# Patient Record
Sex: Female | Born: 1935
Health system: Southern US, Community
[De-identification: ages and names within clinical notes are randomized; demographics above are authoritative.]

## PROBLEM LIST (undated history)

## (undated) DIAGNOSIS — F32A Depression, unspecified: Secondary | ICD-10-CM

## (undated) DIAGNOSIS — E034 Atrophy of thyroid (acquired): Secondary | ICD-10-CM

## (undated) DIAGNOSIS — F0150 Vascular dementia without behavioral disturbance: Secondary | ICD-10-CM

## (undated) DIAGNOSIS — I509 Heart failure, unspecified: Secondary | ICD-10-CM

## (undated) DIAGNOSIS — F329 Major depressive disorder, single episode, unspecified: Secondary | ICD-10-CM

## (undated) DIAGNOSIS — G43909 Migraine, unspecified, not intractable, without status migrainosus: Secondary | ICD-10-CM

## (undated) DIAGNOSIS — I639 Cerebral infarction, unspecified: Secondary | ICD-10-CM

## (undated) DIAGNOSIS — E039 Hypothyroidism, unspecified: Secondary | ICD-10-CM

## (undated) HISTORY — DX: Migraine, unspecified, not intractable, without status migrainosus: G43.909

## (undated) HISTORY — PX: ABDOMINAL HYSTERECTOMY: SHX81

## (undated) HISTORY — DX: Atrophy of thyroid (acquired): E03.4

## (undated) HISTORY — DX: Hypothyroidism, unspecified: E03.9

## (undated) HISTORY — DX: Vascular dementia, unspecified severity, without behavioral disturbance, psychotic disturbance, mood disturbance, and anxiety: F01.50

## (undated) HISTORY — DX: Depression, unspecified: F32.A

---
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
## 2002-06-11 DIAGNOSIS — I639 Cerebral infarction, unspecified: Secondary | ICD-10-CM

## 2002-06-11 HISTORY — DX: Cerebral infarction, unspecified: I63.9

## 2003-04-26 DIAGNOSIS — I69351 Hemiplegia and hemiparesis following cerebral infarction affecting right dominant side: Secondary | ICD-10-CM | POA: Insufficient documentation

## 2014-08-02 DIAGNOSIS — J159 Unspecified bacterial pneumonia: Secondary | ICD-10-CM | POA: Diagnosis not present

## 2014-08-02 DIAGNOSIS — L2081 Atopic neurodermatitis: Secondary | ICD-10-CM | POA: Diagnosis not present

## 2014-08-02 DIAGNOSIS — J0100 Acute maxillary sinusitis, unspecified: Secondary | ICD-10-CM | POA: Diagnosis not present

## 2014-10-06 DIAGNOSIS — J0100 Acute maxillary sinusitis, unspecified: Secondary | ICD-10-CM | POA: Diagnosis not present

## 2014-10-06 DIAGNOSIS — E034 Atrophy of thyroid (acquired): Secondary | ICD-10-CM | POA: Diagnosis not present

## 2014-10-06 DIAGNOSIS — D649 Anemia, unspecified: Secondary | ICD-10-CM | POA: Diagnosis not present

## 2014-12-07 DIAGNOSIS — R312 Other microscopic hematuria: Secondary | ICD-10-CM | POA: Diagnosis not present

## 2014-12-07 DIAGNOSIS — N3000 Acute cystitis without hematuria: Secondary | ICD-10-CM | POA: Diagnosis not present

## 2014-12-07 DIAGNOSIS — M5408 Panniculitis affecting regions of neck and back, sacral and sacrococcygeal region: Secondary | ICD-10-CM | POA: Diagnosis not present

## 2014-12-08 DIAGNOSIS — M5136 Other intervertebral disc degeneration, lumbar region: Secondary | ICD-10-CM | POA: Diagnosis not present

## 2014-12-08 DIAGNOSIS — R319 Hematuria, unspecified: Secondary | ICD-10-CM | POA: Diagnosis not present

## 2014-12-08 DIAGNOSIS — R590 Localized enlarged lymph nodes: Secondary | ICD-10-CM | POA: Diagnosis not present

## 2014-12-08 DIAGNOSIS — M13851 Other specified arthritis, right hip: Secondary | ICD-10-CM | POA: Diagnosis not present

## 2014-12-08 DIAGNOSIS — I517 Cardiomegaly: Secondary | ICD-10-CM | POA: Diagnosis not present

## 2014-12-08 DIAGNOSIS — M47896 Other spondylosis, lumbar region: Secondary | ICD-10-CM | POA: Diagnosis not present

## 2014-12-08 DIAGNOSIS — M13852 Other specified arthritis, left hip: Secondary | ICD-10-CM | POA: Diagnosis not present

## 2014-12-08 DIAGNOSIS — I700 Atherosclerosis of aorta: Secondary | ICD-10-CM | POA: Diagnosis not present

## 2015-01-18 DIAGNOSIS — J0100 Acute maxillary sinusitis, unspecified: Secondary | ICD-10-CM | POA: Diagnosis not present

## 2015-05-18 DIAGNOSIS — J018 Other acute sinusitis: Secondary | ICD-10-CM | POA: Diagnosis not present

## 2015-08-19 DIAGNOSIS — J018 Other acute sinusitis: Secondary | ICD-10-CM | POA: Diagnosis not present

## 2015-08-19 DIAGNOSIS — E034 Atrophy of thyroid (acquired): Secondary | ICD-10-CM | POA: Diagnosis not present

## 2016-04-09 DIAGNOSIS — I69320 Aphasia following cerebral infarction: Secondary | ICD-10-CM | POA: Diagnosis not present

## 2016-04-09 DIAGNOSIS — E034 Atrophy of thyroid (acquired): Secondary | ICD-10-CM | POA: Diagnosis not present

## 2016-06-06 DIAGNOSIS — J208 Acute bronchitis due to other specified organisms: Secondary | ICD-10-CM | POA: Diagnosis not present

## 2016-07-03 DIAGNOSIS — J208 Acute bronchitis due to other specified organisms: Secondary | ICD-10-CM | POA: Diagnosis not present

## 2016-08-13 DIAGNOSIS — M545 Low back pain: Secondary | ICD-10-CM | POA: Diagnosis not present

## 2016-08-13 DIAGNOSIS — N3001 Acute cystitis with hematuria: Secondary | ICD-10-CM | POA: Diagnosis not present

## 2016-10-23 DIAGNOSIS — J208 Acute bronchitis due to other specified organisms: Secondary | ICD-10-CM | POA: Diagnosis not present

## 2016-12-17 DIAGNOSIS — M79661 Pain in right lower leg: Secondary | ICD-10-CM | POA: Diagnosis not present

## 2016-12-19 DIAGNOSIS — M79661 Pain in right lower leg: Secondary | ICD-10-CM | POA: Diagnosis not present

## 2017-01-02 DIAGNOSIS — M1711 Unilateral primary osteoarthritis, right knee: Secondary | ICD-10-CM | POA: Diagnosis not present

## 2017-01-02 DIAGNOSIS — M545 Low back pain: Secondary | ICD-10-CM | POA: Diagnosis not present

## 2017-01-02 DIAGNOSIS — M25861 Other specified joint disorders, right knee: Secondary | ICD-10-CM | POA: Diagnosis not present

## 2017-01-02 DIAGNOSIS — E034 Atrophy of thyroid (acquired): Secondary | ICD-10-CM | POA: Diagnosis not present

## 2017-01-02 DIAGNOSIS — M25552 Pain in left hip: Secondary | ICD-10-CM | POA: Diagnosis not present

## 2017-01-02 DIAGNOSIS — M25551 Pain in right hip: Secondary | ICD-10-CM | POA: Diagnosis not present

## 2017-01-02 DIAGNOSIS — M25559 Pain in unspecified hip: Secondary | ICD-10-CM | POA: Diagnosis not present

## 2017-01-02 DIAGNOSIS — M5136 Other intervertebral disc degeneration, lumbar region: Secondary | ICD-10-CM | POA: Diagnosis not present

## 2017-01-02 DIAGNOSIS — I69320 Aphasia following cerebral infarction: Secondary | ICD-10-CM | POA: Diagnosis not present

## 2017-10-07 DIAGNOSIS — I69320 Aphasia following cerebral infarction: Secondary | ICD-10-CM | POA: Diagnosis not present

## 2017-10-07 DIAGNOSIS — E034 Atrophy of thyroid (acquired): Secondary | ICD-10-CM | POA: Diagnosis not present

## 2018-04-17 DIAGNOSIS — J208 Acute bronchitis due to other specified organisms: Secondary | ICD-10-CM | POA: Diagnosis not present

## 2018-08-05 DIAGNOSIS — R52 Pain, unspecified: Secondary | ICD-10-CM | POA: Diagnosis not present

## 2018-08-05 DIAGNOSIS — R531 Weakness: Secondary | ICD-10-CM | POA: Diagnosis not present

## 2018-08-05 DIAGNOSIS — R05 Cough: Secondary | ICD-10-CM | POA: Diagnosis not present

## 2018-08-05 DIAGNOSIS — Z7902 Long term (current) use of antithrombotics/antiplatelets: Secondary | ICD-10-CM | POA: Diagnosis not present

## 2018-08-05 DIAGNOSIS — R509 Fever, unspecified: Secondary | ICD-10-CM | POA: Diagnosis not present

## 2018-08-05 DIAGNOSIS — J9811 Atelectasis: Secondary | ICD-10-CM | POA: Diagnosis not present

## 2018-08-05 DIAGNOSIS — I69351 Hemiplegia and hemiparesis following cerebral infarction affecting right dominant side: Secondary | ICD-10-CM | POA: Diagnosis not present

## 2018-08-05 DIAGNOSIS — J181 Lobar pneumonia, unspecified organism: Secondary | ICD-10-CM | POA: Diagnosis not present

## 2018-08-05 DIAGNOSIS — I700 Atherosclerosis of aorta: Secondary | ICD-10-CM | POA: Diagnosis not present

## 2018-08-05 DIAGNOSIS — Z79899 Other long term (current) drug therapy: Secondary | ICD-10-CM | POA: Diagnosis not present

## 2018-08-05 DIAGNOSIS — R0902 Hypoxemia: Secondary | ICD-10-CM | POA: Diagnosis not present

## 2018-08-11 DIAGNOSIS — J208 Acute bronchitis due to other specified organisms: Secondary | ICD-10-CM | POA: Diagnosis not present

## 2018-08-11 DIAGNOSIS — E034 Atrophy of thyroid (acquired): Secondary | ICD-10-CM | POA: Diagnosis not present

## 2018-08-11 DIAGNOSIS — I69320 Aphasia following cerebral infarction: Secondary | ICD-10-CM | POA: Diagnosis not present

## 2018-08-19 DIAGNOSIS — J208 Acute bronchitis due to other specified organisms: Secondary | ICD-10-CM | POA: Diagnosis not present

## 2018-10-08 DIAGNOSIS — Z0000 Encounter for general adult medical examination without abnormal findings: Secondary | ICD-10-CM | POA: Diagnosis not present

## 2018-12-08 DIAGNOSIS — N3001 Acute cystitis with hematuria: Secondary | ICD-10-CM | POA: Diagnosis not present

## 2018-12-08 DIAGNOSIS — N3000 Acute cystitis without hematuria: Secondary | ICD-10-CM | POA: Diagnosis not present

## 2019-01-01 DIAGNOSIS — R0602 Shortness of breath: Secondary | ICD-10-CM | POA: Diagnosis not present

## 2019-01-03 DIAGNOSIS — M1990 Unspecified osteoarthritis, unspecified site: Secondary | ICD-10-CM | POA: Diagnosis not present

## 2019-01-03 DIAGNOSIS — Z87440 Personal history of urinary (tract) infections: Secondary | ICD-10-CM | POA: Diagnosis not present

## 2019-01-03 DIAGNOSIS — E039 Hypothyroidism, unspecified: Secondary | ICD-10-CM | POA: Diagnosis not present

## 2019-01-03 DIAGNOSIS — I69320 Aphasia following cerebral infarction: Secondary | ICD-10-CM | POA: Diagnosis not present

## 2019-01-03 DIAGNOSIS — K259 Gastric ulcer, unspecified as acute or chronic, without hemorrhage or perforation: Secondary | ICD-10-CM | POA: Diagnosis not present

## 2019-01-03 DIAGNOSIS — I69354 Hemiplegia and hemiparesis following cerebral infarction affecting left non-dominant side: Secondary | ICD-10-CM | POA: Diagnosis not present

## 2019-01-03 DIAGNOSIS — N3001 Acute cystitis with hematuria: Secondary | ICD-10-CM | POA: Diagnosis not present

## 2019-01-03 DIAGNOSIS — Z7902 Long term (current) use of antithrombotics/antiplatelets: Secondary | ICD-10-CM | POA: Diagnosis not present

## 2019-01-03 DIAGNOSIS — G43909 Migraine, unspecified, not intractable, without status migrainosus: Secondary | ICD-10-CM | POA: Diagnosis not present

## 2019-01-05 DIAGNOSIS — D6489 Other specified anemias: Secondary | ICD-10-CM | POA: Diagnosis not present

## 2019-01-06 DIAGNOSIS — K259 Gastric ulcer, unspecified as acute or chronic, without hemorrhage or perforation: Secondary | ICD-10-CM | POA: Diagnosis not present

## 2019-01-06 DIAGNOSIS — I69320 Aphasia following cerebral infarction: Secondary | ICD-10-CM | POA: Diagnosis not present

## 2019-01-06 DIAGNOSIS — Z87440 Personal history of urinary (tract) infections: Secondary | ICD-10-CM | POA: Diagnosis not present

## 2019-01-06 DIAGNOSIS — E039 Hypothyroidism, unspecified: Secondary | ICD-10-CM | POA: Diagnosis not present

## 2019-01-06 DIAGNOSIS — I69354 Hemiplegia and hemiparesis following cerebral infarction affecting left non-dominant side: Secondary | ICD-10-CM | POA: Diagnosis not present

## 2019-01-06 DIAGNOSIS — G43909 Migraine, unspecified, not intractable, without status migrainosus: Secondary | ICD-10-CM | POA: Diagnosis not present

## 2019-01-06 DIAGNOSIS — Z7902 Long term (current) use of antithrombotics/antiplatelets: Secondary | ICD-10-CM | POA: Diagnosis not present

## 2019-01-06 DIAGNOSIS — M1990 Unspecified osteoarthritis, unspecified site: Secondary | ICD-10-CM | POA: Diagnosis not present

## 2019-01-06 DIAGNOSIS — N3001 Acute cystitis with hematuria: Secondary | ICD-10-CM | POA: Diagnosis not present

## 2019-01-09 DIAGNOSIS — N3001 Acute cystitis with hematuria: Secondary | ICD-10-CM | POA: Diagnosis not present

## 2019-01-09 DIAGNOSIS — Z87440 Personal history of urinary (tract) infections: Secondary | ICD-10-CM | POA: Diagnosis not present

## 2019-01-09 DIAGNOSIS — I69320 Aphasia following cerebral infarction: Secondary | ICD-10-CM | POA: Diagnosis not present

## 2019-01-09 DIAGNOSIS — M1990 Unspecified osteoarthritis, unspecified site: Secondary | ICD-10-CM | POA: Diagnosis not present

## 2019-01-09 DIAGNOSIS — I69354 Hemiplegia and hemiparesis following cerebral infarction affecting left non-dominant side: Secondary | ICD-10-CM | POA: Diagnosis not present

## 2019-01-09 DIAGNOSIS — Z7902 Long term (current) use of antithrombotics/antiplatelets: Secondary | ICD-10-CM | POA: Diagnosis not present

## 2019-01-09 DIAGNOSIS — G43909 Migraine, unspecified, not intractable, without status migrainosus: Secondary | ICD-10-CM | POA: Diagnosis not present

## 2019-01-09 DIAGNOSIS — E039 Hypothyroidism, unspecified: Secondary | ICD-10-CM | POA: Diagnosis not present

## 2019-01-09 DIAGNOSIS — K259 Gastric ulcer, unspecified as acute or chronic, without hemorrhage or perforation: Secondary | ICD-10-CM | POA: Diagnosis not present

## 2019-01-13 DIAGNOSIS — K259 Gastric ulcer, unspecified as acute or chronic, without hemorrhage or perforation: Secondary | ICD-10-CM | POA: Diagnosis not present

## 2019-01-13 DIAGNOSIS — M1990 Unspecified osteoarthritis, unspecified site: Secondary | ICD-10-CM | POA: Diagnosis not present

## 2019-01-13 DIAGNOSIS — Z7902 Long term (current) use of antithrombotics/antiplatelets: Secondary | ICD-10-CM | POA: Diagnosis not present

## 2019-01-13 DIAGNOSIS — G43909 Migraine, unspecified, not intractable, without status migrainosus: Secondary | ICD-10-CM | POA: Diagnosis not present

## 2019-01-13 DIAGNOSIS — N3001 Acute cystitis with hematuria: Secondary | ICD-10-CM | POA: Diagnosis not present

## 2019-01-13 DIAGNOSIS — I69320 Aphasia following cerebral infarction: Secondary | ICD-10-CM | POA: Diagnosis not present

## 2019-01-13 DIAGNOSIS — I69354 Hemiplegia and hemiparesis following cerebral infarction affecting left non-dominant side: Secondary | ICD-10-CM | POA: Diagnosis not present

## 2019-01-13 DIAGNOSIS — Z87440 Personal history of urinary (tract) infections: Secondary | ICD-10-CM | POA: Diagnosis not present

## 2019-01-13 DIAGNOSIS — E039 Hypothyroidism, unspecified: Secondary | ICD-10-CM | POA: Diagnosis not present

## 2019-01-14 DIAGNOSIS — E039 Hypothyroidism, unspecified: Secondary | ICD-10-CM | POA: Diagnosis not present

## 2019-01-14 DIAGNOSIS — M1990 Unspecified osteoarthritis, unspecified site: Secondary | ICD-10-CM | POA: Diagnosis not present

## 2019-01-14 DIAGNOSIS — Z7902 Long term (current) use of antithrombotics/antiplatelets: Secondary | ICD-10-CM | POA: Diagnosis not present

## 2019-01-14 DIAGNOSIS — K259 Gastric ulcer, unspecified as acute or chronic, without hemorrhage or perforation: Secondary | ICD-10-CM | POA: Diagnosis not present

## 2019-01-14 DIAGNOSIS — I69320 Aphasia following cerebral infarction: Secondary | ICD-10-CM | POA: Diagnosis not present

## 2019-01-14 DIAGNOSIS — N3001 Acute cystitis with hematuria: Secondary | ICD-10-CM | POA: Diagnosis not present

## 2019-01-14 DIAGNOSIS — I69354 Hemiplegia and hemiparesis following cerebral infarction affecting left non-dominant side: Secondary | ICD-10-CM | POA: Diagnosis not present

## 2019-01-14 DIAGNOSIS — G43909 Migraine, unspecified, not intractable, without status migrainosus: Secondary | ICD-10-CM | POA: Diagnosis not present

## 2019-01-14 DIAGNOSIS — Z87440 Personal history of urinary (tract) infections: Secondary | ICD-10-CM | POA: Diagnosis not present

## 2019-01-15 DIAGNOSIS — J208 Acute bronchitis due to other specified organisms: Secondary | ICD-10-CM | POA: Diagnosis not present

## 2019-01-15 DIAGNOSIS — I69320 Aphasia following cerebral infarction: Secondary | ICD-10-CM | POA: Diagnosis not present

## 2019-01-15 DIAGNOSIS — I69354 Hemiplegia and hemiparesis following cerebral infarction affecting left non-dominant side: Secondary | ICD-10-CM | POA: Diagnosis not present

## 2019-01-15 DIAGNOSIS — Z87440 Personal history of urinary (tract) infections: Secondary | ICD-10-CM | POA: Diagnosis not present

## 2019-01-15 DIAGNOSIS — K259 Gastric ulcer, unspecified as acute or chronic, without hemorrhage or perforation: Secondary | ICD-10-CM | POA: Diagnosis not present

## 2019-01-15 DIAGNOSIS — Z7902 Long term (current) use of antithrombotics/antiplatelets: Secondary | ICD-10-CM | POA: Diagnosis not present

## 2019-01-15 DIAGNOSIS — E039 Hypothyroidism, unspecified: Secondary | ICD-10-CM | POA: Diagnosis not present

## 2019-01-15 DIAGNOSIS — G43909 Migraine, unspecified, not intractable, without status migrainosus: Secondary | ICD-10-CM | POA: Diagnosis not present

## 2019-01-15 DIAGNOSIS — M1990 Unspecified osteoarthritis, unspecified site: Secondary | ICD-10-CM | POA: Diagnosis not present

## 2019-01-15 DIAGNOSIS — N3001 Acute cystitis with hematuria: Secondary | ICD-10-CM | POA: Diagnosis not present

## 2019-01-20 DIAGNOSIS — E039 Hypothyroidism, unspecified: Secondary | ICD-10-CM | POA: Diagnosis not present

## 2019-01-20 DIAGNOSIS — N3001 Acute cystitis with hematuria: Secondary | ICD-10-CM | POA: Diagnosis not present

## 2019-01-20 DIAGNOSIS — I69354 Hemiplegia and hemiparesis following cerebral infarction affecting left non-dominant side: Secondary | ICD-10-CM | POA: Diagnosis not present

## 2019-01-20 DIAGNOSIS — Z87440 Personal history of urinary (tract) infections: Secondary | ICD-10-CM | POA: Diagnosis not present

## 2019-01-20 DIAGNOSIS — K259 Gastric ulcer, unspecified as acute or chronic, without hemorrhage or perforation: Secondary | ICD-10-CM | POA: Diagnosis not present

## 2019-01-20 DIAGNOSIS — G43909 Migraine, unspecified, not intractable, without status migrainosus: Secondary | ICD-10-CM | POA: Diagnosis not present

## 2019-01-20 DIAGNOSIS — M1990 Unspecified osteoarthritis, unspecified site: Secondary | ICD-10-CM | POA: Diagnosis not present

## 2019-01-20 DIAGNOSIS — Z7902 Long term (current) use of antithrombotics/antiplatelets: Secondary | ICD-10-CM | POA: Diagnosis not present

## 2019-01-20 DIAGNOSIS — I69320 Aphasia following cerebral infarction: Secondary | ICD-10-CM | POA: Diagnosis not present

## 2019-01-22 DIAGNOSIS — I69354 Hemiplegia and hemiparesis following cerebral infarction affecting left non-dominant side: Secondary | ICD-10-CM | POA: Diagnosis not present

## 2019-01-22 DIAGNOSIS — G43909 Migraine, unspecified, not intractable, without status migrainosus: Secondary | ICD-10-CM | POA: Diagnosis not present

## 2019-01-22 DIAGNOSIS — Z87440 Personal history of urinary (tract) infections: Secondary | ICD-10-CM | POA: Diagnosis not present

## 2019-01-22 DIAGNOSIS — E039 Hypothyroidism, unspecified: Secondary | ICD-10-CM | POA: Diagnosis not present

## 2019-01-22 DIAGNOSIS — N3001 Acute cystitis with hematuria: Secondary | ICD-10-CM | POA: Diagnosis not present

## 2019-01-22 DIAGNOSIS — M1990 Unspecified osteoarthritis, unspecified site: Secondary | ICD-10-CM | POA: Diagnosis not present

## 2019-01-22 DIAGNOSIS — I69320 Aphasia following cerebral infarction: Secondary | ICD-10-CM | POA: Diagnosis not present

## 2019-01-22 DIAGNOSIS — Z7902 Long term (current) use of antithrombotics/antiplatelets: Secondary | ICD-10-CM | POA: Diagnosis not present

## 2019-01-22 DIAGNOSIS — K259 Gastric ulcer, unspecified as acute or chronic, without hemorrhage or perforation: Secondary | ICD-10-CM | POA: Diagnosis not present

## 2019-01-27 DIAGNOSIS — E039 Hypothyroidism, unspecified: Secondary | ICD-10-CM | POA: Diagnosis not present

## 2019-01-27 DIAGNOSIS — M1990 Unspecified osteoarthritis, unspecified site: Secondary | ICD-10-CM | POA: Diagnosis not present

## 2019-01-27 DIAGNOSIS — Z7902 Long term (current) use of antithrombotics/antiplatelets: Secondary | ICD-10-CM | POA: Diagnosis not present

## 2019-01-27 DIAGNOSIS — Z87440 Personal history of urinary (tract) infections: Secondary | ICD-10-CM | POA: Diagnosis not present

## 2019-01-27 DIAGNOSIS — I69320 Aphasia following cerebral infarction: Secondary | ICD-10-CM | POA: Diagnosis not present

## 2019-01-27 DIAGNOSIS — I69354 Hemiplegia and hemiparesis following cerebral infarction affecting left non-dominant side: Secondary | ICD-10-CM | POA: Diagnosis not present

## 2019-01-27 DIAGNOSIS — K259 Gastric ulcer, unspecified as acute or chronic, without hemorrhage or perforation: Secondary | ICD-10-CM | POA: Diagnosis not present

## 2019-01-27 DIAGNOSIS — N3001 Acute cystitis with hematuria: Secondary | ICD-10-CM | POA: Diagnosis not present

## 2019-01-27 DIAGNOSIS — G43909 Migraine, unspecified, not intractable, without status migrainosus: Secondary | ICD-10-CM | POA: Diagnosis not present

## 2019-01-29 DIAGNOSIS — K259 Gastric ulcer, unspecified as acute or chronic, without hemorrhage or perforation: Secondary | ICD-10-CM | POA: Diagnosis not present

## 2019-01-29 DIAGNOSIS — M1990 Unspecified osteoarthritis, unspecified site: Secondary | ICD-10-CM | POA: Diagnosis not present

## 2019-01-29 DIAGNOSIS — E039 Hypothyroidism, unspecified: Secondary | ICD-10-CM | POA: Diagnosis not present

## 2019-01-29 DIAGNOSIS — I69354 Hemiplegia and hemiparesis following cerebral infarction affecting left non-dominant side: Secondary | ICD-10-CM | POA: Diagnosis not present

## 2019-01-29 DIAGNOSIS — Z7902 Long term (current) use of antithrombotics/antiplatelets: Secondary | ICD-10-CM | POA: Diagnosis not present

## 2019-01-29 DIAGNOSIS — N3001 Acute cystitis with hematuria: Secondary | ICD-10-CM | POA: Diagnosis not present

## 2019-01-29 DIAGNOSIS — Z87440 Personal history of urinary (tract) infections: Secondary | ICD-10-CM | POA: Diagnosis not present

## 2019-01-29 DIAGNOSIS — I69320 Aphasia following cerebral infarction: Secondary | ICD-10-CM | POA: Diagnosis not present

## 2019-01-29 DIAGNOSIS — G43909 Migraine, unspecified, not intractable, without status migrainosus: Secondary | ICD-10-CM | POA: Diagnosis not present

## 2019-01-31 ENCOUNTER — Encounter (HOSPITAL_COMMUNITY): Payer: Self-pay

## 2019-01-31 ENCOUNTER — Emergency Department (HOSPITAL_COMMUNITY)
Admission: EM | Admit: 2019-01-31 | Discharge: 2019-01-31 | Disposition: A | Discharge status: Home / Self Care | Payer: Medicare Other | Attending: Emergency Medicine | Admitting: Emergency Medicine

## 2019-01-31 ENCOUNTER — Other Ambulatory Visit: Payer: Self-pay

## 2019-01-31 ENCOUNTER — Emergency Department (HOSPITAL_COMMUNITY): Payer: Medicare Other

## 2019-01-31 DIAGNOSIS — R05 Cough: Secondary | ICD-10-CM | POA: Diagnosis not present

## 2019-01-31 DIAGNOSIS — R059 Cough, unspecified: Secondary | ICD-10-CM

## 2019-01-31 DIAGNOSIS — I509 Heart failure, unspecified: Secondary | ICD-10-CM | POA: Diagnosis not present

## 2019-01-31 DIAGNOSIS — R0981 Nasal congestion: Secondary | ICD-10-CM | POA: Diagnosis not present

## 2019-01-31 DIAGNOSIS — R0989 Other specified symptoms and signs involving the circulatory and respiratory systems: Secondary | ICD-10-CM | POA: Diagnosis not present

## 2019-01-31 HISTORY — DX: Heart failure, unspecified: I50.9

## 2019-01-31 LAB — CBC
HCT: 38.3 % (ref 36.0–46.0)
Hemoglobin: 12.2 g/dL (ref 12.0–15.0)
MCH: 31.4 pg (ref 26.0–34.0)
MCHC: 31.9 g/dL (ref 30.0–36.0)
MCV: 98.7 fL (ref 80.0–100.0)
Platelets: 274 10*3/uL (ref 150–400)
RBC: 3.88 MIL/uL (ref 3.87–5.11)
RDW: 13.2 % (ref 11.5–15.5)
WBC: 7.8 10*3/uL (ref 4.0–10.5)
nRBC: 0 % (ref 0.0–0.2)

## 2019-01-31 LAB — BASIC METABOLIC PANEL
Anion gap: 13 (ref 5–15)
BUN: 17 mg/dL (ref 8–23)
CO2: 24 mmol/L (ref 22–32)
Calcium: 8.9 mg/dL (ref 8.9–10.3)
Chloride: 100 mmol/L (ref 98–111)
Creatinine, Ser: 0.84 mg/dL (ref 0.44–1.00)
GFR calc Af Amer: >60 mL/min (ref >60)
GFR calc non Af Amer: >60 mL/min (ref >60)
Glucose, Bld: 115 mg/dL — ABNORMAL HIGH (ref 70–99)
Potassium: 3.7 mmol/L (ref 3.5–5.1)
Sodium: 137 mmol/L (ref 135–145)

## 2019-01-31 MED ORDER — ALBUTEROL SULFATE HFA 108 (90 BASE) MCG/ACT IN AERS: 1.0000 | INHALATION_SPRAY | Freq: Four times a day (QID) | RESPIRATORY_TRACT | 0 refills | Status: DC | PRN

## 2019-01-31 MED ORDER — SODIUM CHLORIDE 0.9% FLUSH: 3.0000 mL | Freq: Once | INTRAVENOUS | Status: DC

## 2019-01-31 MED ORDER — BENZONATATE 100 MG PO CAPS: 100.0000 mg | ORAL_CAPSULE | Freq: Three times a day (TID) | ORAL | 0 refills | Status: DC | PRN

## 2019-01-31 MED ORDER — FLUTICASONE PROPIONATE 50 MCG/ACT NA SUSP: 1.0000 | Freq: Every day | NASAL | 0 refills | Status: DC

## 2019-01-31 NOTE — ED Triage Notes (Signed)
Pt accompanied by family member who states she has had increased chest congestion, hx of CHF on lasix. Pt coughing up phlegm, no fevers. Family reports having a hard time getting an appointment with her PCP, they told her to come here.

## 2019-01-31 NOTE — ED Notes (Signed)
Patient transported to X-ray 

## 2019-01-31 NOTE — ED Provider Notes (Addendum)
[facility]   CSN: [id number] Arrival date & time: 01/31/19  1035     History   Chief Complaint Chief Complaint  Patient presents with  . Chest Congestion    HPI Veronica [name] is a 83 y.o. female with a hx of CHF & prior stroke w/ significant residual deficits who presents to the ED with her daughter with concern for chest congestion for the past 2-3 months.  Level 5 caveat applies secondary to patient's baseline difficulty with speech s/p CVA. Patient's daughter provided history- she states that for the past 2-3 months the patient has had issues with cough productive of phelgm sputum, states initially yellow now more of a yellow/brown & very thick. The patient's breathing has sounded noisy with crackling & wheezing at times, and she also believes she is somewhat short of breath intermittently. No alleviating/aggravating factors. Seen by PCP and given Augment for 10 days BID which was completed 1 week prior without improvement. Called PCP about this who directed them to the ED. She notes she has had similar noisy breathing & cough that cleared w/ abx in the past but this time did not. She had issues with peripheral edema that began a few months ago but this has been controlled with Lasix & she does not have any acute worsening of leg swelling, states they look at baseline. Patient lives @ home with her daughter who assists her, there is homehealth, she walks very little & mostly is pushed in a wheelchair. Denies fever, complaints of pain, recent sick contacts w/ similar, vomiting, or diarrhea. No mental status change from baseline.      HPI  Past Medical History:  Diagnosis Date  . CHF (congestive heart failure) (HCC)   . Stroke [facility](HCC) 2004    There are no active problems to display for this patient.   Past Surgical History:  Procedure Laterality Date  . ABDOMINAL HYSTERECTOMY       OB History   No obstetric history on  file.      Home Medications    Prior to Admission medications   Not on File    Family History No family history on file.  Social History Social History   Tobacco Use  . Smoking status: Not on file  Substance Use Topics  . Alcohol use: Not on file  . Drug use: Not on file     Allergies   Patient has no allergy information on record.   Review of Systems Review of Systems  Unable to perform ROS: Other (Patient minimally verbal s/p CVA)     Physical Exam Updated Vital Signs BP (!) 143/74   Pulse 99   Temp 99.3 F (37.4 C) (Oral)   Resp 17   SpO2 95%   Physical Exam Vitals signs and nursing note reviewed.  Constitutional:      General: She is not in acute distress.    Appearance: She is well-developed.  HENT:     Head: Normocephalic and atraumatic.     Right Ear: Ear canal normal. Tympanic membrane is not perforated, erythematous, retracted or bulging.     Left Ear: Ear canal normal. Tympanic membrane is not perforated, erythematous, retracted or bulging.     Ears:     Comments: No mastoid erythema/swelling/tenderness.  L ear w/ hearing aide in place.     Nose: Congestion present.     Right Sinus: No maxillary sinus tenderness or frontal sinus tenderness.  Left Sinus: No maxillary sinus tenderness or frontal sinus tenderness.     Mouth/Throat:     Pharynx: Uvula midline. No oropharyngeal exudate or posterior oropharyngeal erythema.  Eyes:     General:        Right eye: No discharge.        Left eye: No discharge.     Conjunctiva/sclera: Conjunctivae normal.     Pupils: Pupils are equal, round, and reactive to light.  Neck:     Musculoskeletal: Normal range of motion and neck supple. No edema or neck rigidity.  Cardiovascular:     Rate and Rhythm: Normal rate and regular rhythm.     Heart sounds: No murmur.  Pulmonary:     Effort: Pulmonary effort is normal. No respiratory distress.     Breath sounds: No wheezing.     Comments: Course bibasilar  breath sounds.  Abdominal:     General: There is no distension.     Palpations: Abdomen is soft.     Tenderness: There is no abdominal tenderness.  Musculoskeletal:     Comments: Trace distal lower leg edema w/o overlying erythema/warmth.   Lymphadenopathy:     Cervical: No cervical adenopathy.  Skin:    General: Skin is warm and dry.     Findings: No rash.  Neurological:     Mental Status: She is alert.  Psychiatric:        Behavior: Behavior normal.    ED Treatments / Results  Labs (all labs ordered are listed, but only abnormal results are displayed) Labs Reviewed  BASIC METABOLIC PANEL - Abnormal; Notable for the following components:      Result Value   Glucose, Bld 115 (*)    All other components within normal limits  CBC    EKG EKG Interpretation  Date/Time:  Saturday January 31 2019 11:47:32 EDT Ventricular Rate:  98 PR Interval:  184 QRS Duration: 72 QT Interval:  360 QTC Calculation: 459 R Axis:   -28 Text Interpretation:  Normal sinus rhythm Low voltage QRS Cannot rule out Anterior infarct , age undetermined Abnormal ECG Confirmed by Margarita Grizzleay, Danielle [phone] on 01/31/2019 1:37:35 PM   Radiology Dg Chest 2 View  Result Date: 01/31/2019 CLINICAL DATA:  Chest congestion.  Hx CHF. EXAM: CHEST - 2 VIEW COMPARISON:  08/05/2018 FINDINGS: Heart size is normal. The aorta is atherosclerotic. Lungs are free of focal consolidations and pleural effusions. No edema. IMPRESSION: No evidence for acute cardiopulmonary abnormality. Aortic atherosclerosis.  (ICD10-I70.0) Electronically Signed   By: Norva PavlovElizabeth  Brown M.D.   On: 01/31/2019 13:30    Procedures Procedures (including critical care time)  Medications Ordered in ED Medications  sodium chloride flush (NS) 0.9 % injection 3 mL (has no administration in time range)     Initial Impression / Assessment and Plan / ED Course  I have reviewed the triage vital signs and the nursing notes.  Pertinent labs & imaging  results that were available during my care of the patient were reviewed by me and considered in my medical decision making (see chart for details).   Patient presents to the ED w/ her daughter who has provided history for evaluation of cough & chest congestion x 2-3 months. Recently completed 10 day course of Augmentin 1 week prior without significant improvement. Patient is nontoxic appearing, resting comfortably, vitals without significant abnormality. Exam with course bibasilar breath sounds somewhat but no signs of respiratory distress.   CBC: No leukocytosis or anemia.  BMP: No significant  electrolyte derangement. Mild hyperglycemia.  EKG: Normal sinus rhythm, no STEMI- no chest pain.  CXR: No evidence for acute cardiopulmonary abnormality. Aortic atherosclerosis.    CXR w/o fluids overload, no peripheral edema- not consistent w/ CHF exacerbation.  No infiltrate on CXR to suggest pneumonia, no fever or sinus tenderness s/p augmentin- doubt acute bacterial sinusitis- do not feel additional abx are necessary at this time.  No wheezing, no hx of tobacco abuse- does not seem like COPD.  Additionally no underlying anemia or electrolyte derangement.  Low risk wells- doubt PE.   Unclear definitive etiology but overall patient is well appearing, not in respiratory distress, appears appropriate for discharge home. Will give flonase, tessalon, & trial inhaler for symptomatic management. I discussed results, treatment plan, need for follow-up, and return precautions with the patient & her daughter. Provided opportunity for questions, patient's daughter confirmed understanding and is in agreement with plan.   Findings and plan of care discussed with supervising physician Dr. Jeanell Sparrow who has evaluated patient in the ED & is in agreement.    Final Clinical Impressions(s) / ED Diagnoses   Final diagnoses:  Cough    ED Discharge Orders         Ordered    fluticasone (FLONASE) 50 MCG/ACT nasal spray   Daily     01/31/19 1513    benzonatate (TESSALON) 100 MG capsule  3 times daily PRN     01/31/19 1513    albuterol (VENTOLIN HFA) 108 (90 Base) MCG/ACT inhaler  Every 6 hours PRN     01/31/19 1513           Dolorez Jeffrey, Glynda Jaeger, PA-C 01/31/19 1520    Pattricia Boss, MD 01/31/19 1537  Ordered covid swab after discharge paperwork printed unfortunately patient has left the department. Suspicion for this is low given sxs for 2-3 months at this time.  BERDIA LACHMAN was evaluated in Emergency Department on 01/31/2019 for the symptoms described in the history of present illness. He/she was evaluated in the context of the global COVID-19 pandemic, which necessitated consideration that the patient might be at risk for infection with the SARS-CoV-2 virus that causes COVID-19. Institutional protocols and algorithms that pertain to the evaluation of patients at risk for COVID-19 are in a state of rapid change based on information released by regulatory bodies including the CDC and federal and state organizations. These policies and algorithms were followed during the patient's care in the ED.     Leafy Kindle 01/31/19 1539    Pattricia Boss, MD 02/01/19 1520

## 2019-01-31 NOTE — Discharge Instructions (Addendum)
You were seen in the ER today for cough & chest congestion.   Your chest x-ray did not show pneumonia, fluid overload, or other acute abnormalities. There is some aortic atherosclerosis- plaque in the artery in the chest but this is not a new finding that would be causing your symptoms today, but instead something that should be followed up by primary care.   Labs were all normal.   We are sending you home with medicines to try to help manage your symptoms:  - Flonase- use 1 spray per nostril daily for congestion.  - Tessalon- take every 8 hours as needed for coughing - Inhaler- use 1-2 puffs every 6 hours for wheezing/shortness of breath.   We have prescribed you new medication(s) today. Discuss the medications prescribed today with your pharmacist as they can have adverse effects and interactions with your other medicines including over the counter and prescribed medications. Seek medical evaluation if you start to experience new or abnormal symptoms after taking one of these medicines, seek care immediately if you start to experience difficulty breathing, feeling of your throat closing, facial swelling, or rash as these could be indications of a more serious allergic reaction  Follow up with primary care within 3 days. Return to the ED for new or worsening symptoms including but not limited to fever, trouble breathing, chest pain, passing out, or any other concerns.

## 2019-02-03 DIAGNOSIS — I69354 Hemiplegia and hemiparesis following cerebral infarction affecting left non-dominant side: Secondary | ICD-10-CM | POA: Diagnosis not present

## 2019-02-03 DIAGNOSIS — G43909 Migraine, unspecified, not intractable, without status migrainosus: Secondary | ICD-10-CM | POA: Diagnosis not present

## 2019-02-03 DIAGNOSIS — K259 Gastric ulcer, unspecified as acute or chronic, without hemorrhage or perforation: Secondary | ICD-10-CM | POA: Diagnosis not present

## 2019-02-03 DIAGNOSIS — I69320 Aphasia following cerebral infarction: Secondary | ICD-10-CM | POA: Diagnosis not present

## 2019-02-03 DIAGNOSIS — N3001 Acute cystitis with hematuria: Secondary | ICD-10-CM | POA: Diagnosis not present

## 2019-02-03 DIAGNOSIS — Z87440 Personal history of urinary (tract) infections: Secondary | ICD-10-CM | POA: Diagnosis not present

## 2019-02-03 DIAGNOSIS — M1990 Unspecified osteoarthritis, unspecified site: Secondary | ICD-10-CM | POA: Diagnosis not present

## 2019-02-03 DIAGNOSIS — Z7902 Long term (current) use of antithrombotics/antiplatelets: Secondary | ICD-10-CM | POA: Diagnosis not present

## 2019-02-03 DIAGNOSIS — E039 Hypothyroidism, unspecified: Secondary | ICD-10-CM | POA: Diagnosis not present

## 2019-02-05 DIAGNOSIS — I69354 Hemiplegia and hemiparesis following cerebral infarction affecting left non-dominant side: Secondary | ICD-10-CM | POA: Diagnosis not present

## 2019-02-05 DIAGNOSIS — M1990 Unspecified osteoarthritis, unspecified site: Secondary | ICD-10-CM | POA: Diagnosis not present

## 2019-02-05 DIAGNOSIS — K259 Gastric ulcer, unspecified as acute or chronic, without hemorrhage or perforation: Secondary | ICD-10-CM | POA: Diagnosis not present

## 2019-02-05 DIAGNOSIS — G43909 Migraine, unspecified, not intractable, without status migrainosus: Secondary | ICD-10-CM | POA: Diagnosis not present

## 2019-02-05 DIAGNOSIS — I69320 Aphasia following cerebral infarction: Secondary | ICD-10-CM | POA: Diagnosis not present

## 2019-02-05 DIAGNOSIS — E039 Hypothyroidism, unspecified: Secondary | ICD-10-CM | POA: Diagnosis not present

## 2019-02-05 DIAGNOSIS — N3001 Acute cystitis with hematuria: Secondary | ICD-10-CM | POA: Diagnosis not present

## 2019-02-05 DIAGNOSIS — Z7902 Long term (current) use of antithrombotics/antiplatelets: Secondary | ICD-10-CM | POA: Diagnosis not present

## 2019-02-05 DIAGNOSIS — Z87440 Personal history of urinary (tract) infections: Secondary | ICD-10-CM | POA: Diagnosis not present

## 2019-02-06 DIAGNOSIS — M1990 Unspecified osteoarthritis, unspecified site: Secondary | ICD-10-CM | POA: Diagnosis not present

## 2019-02-06 DIAGNOSIS — G43909 Migraine, unspecified, not intractable, without status migrainosus: Secondary | ICD-10-CM | POA: Diagnosis not present

## 2019-02-06 DIAGNOSIS — I69320 Aphasia following cerebral infarction: Secondary | ICD-10-CM | POA: Diagnosis not present

## 2019-02-06 DIAGNOSIS — Z87440 Personal history of urinary (tract) infections: Secondary | ICD-10-CM | POA: Diagnosis not present

## 2019-02-06 DIAGNOSIS — E039 Hypothyroidism, unspecified: Secondary | ICD-10-CM | POA: Diagnosis not present

## 2019-02-06 DIAGNOSIS — I69354 Hemiplegia and hemiparesis following cerebral infarction affecting left non-dominant side: Secondary | ICD-10-CM | POA: Diagnosis not present

## 2019-02-06 DIAGNOSIS — K259 Gastric ulcer, unspecified as acute or chronic, without hemorrhage or perforation: Secondary | ICD-10-CM | POA: Diagnosis not present

## 2019-02-06 DIAGNOSIS — Z7902 Long term (current) use of antithrombotics/antiplatelets: Secondary | ICD-10-CM | POA: Diagnosis not present

## 2019-02-06 DIAGNOSIS — N3001 Acute cystitis with hematuria: Secondary | ICD-10-CM | POA: Diagnosis not present

## 2019-02-10 DIAGNOSIS — Z7902 Long term (current) use of antithrombotics/antiplatelets: Secondary | ICD-10-CM | POA: Diagnosis not present

## 2019-02-10 DIAGNOSIS — I69354 Hemiplegia and hemiparesis following cerebral infarction affecting left non-dominant side: Secondary | ICD-10-CM | POA: Diagnosis not present

## 2019-02-10 DIAGNOSIS — K259 Gastric ulcer, unspecified as acute or chronic, without hemorrhage or perforation: Secondary | ICD-10-CM | POA: Diagnosis not present

## 2019-02-10 DIAGNOSIS — I69320 Aphasia following cerebral infarction: Secondary | ICD-10-CM | POA: Diagnosis not present

## 2019-02-10 DIAGNOSIS — M1990 Unspecified osteoarthritis, unspecified site: Secondary | ICD-10-CM | POA: Diagnosis not present

## 2019-02-10 DIAGNOSIS — N3001 Acute cystitis with hematuria: Secondary | ICD-10-CM | POA: Diagnosis not present

## 2019-02-10 DIAGNOSIS — G43909 Migraine, unspecified, not intractable, without status migrainosus: Secondary | ICD-10-CM | POA: Diagnosis not present

## 2019-02-10 DIAGNOSIS — E039 Hypothyroidism, unspecified: Secondary | ICD-10-CM | POA: Diagnosis not present

## 2019-02-10 DIAGNOSIS — Z87440 Personal history of urinary (tract) infections: Secondary | ICD-10-CM | POA: Diagnosis not present

## 2019-02-12 DIAGNOSIS — E039 Hypothyroidism, unspecified: Secondary | ICD-10-CM | POA: Diagnosis not present

## 2019-02-12 DIAGNOSIS — M1990 Unspecified osteoarthritis, unspecified site: Secondary | ICD-10-CM | POA: Diagnosis not present

## 2019-02-12 DIAGNOSIS — I69320 Aphasia following cerebral infarction: Secondary | ICD-10-CM | POA: Diagnosis not present

## 2019-02-12 DIAGNOSIS — Z7902 Long term (current) use of antithrombotics/antiplatelets: Secondary | ICD-10-CM | POA: Diagnosis not present

## 2019-02-12 DIAGNOSIS — K259 Gastric ulcer, unspecified as acute or chronic, without hemorrhage or perforation: Secondary | ICD-10-CM | POA: Diagnosis not present

## 2019-02-12 DIAGNOSIS — G43909 Migraine, unspecified, not intractable, without status migrainosus: Secondary | ICD-10-CM | POA: Diagnosis not present

## 2019-02-12 DIAGNOSIS — Z87440 Personal history of urinary (tract) infections: Secondary | ICD-10-CM | POA: Diagnosis not present

## 2019-02-12 DIAGNOSIS — I69354 Hemiplegia and hemiparesis following cerebral infarction affecting left non-dominant side: Secondary | ICD-10-CM | POA: Diagnosis not present

## 2019-02-12 DIAGNOSIS — N3001 Acute cystitis with hematuria: Secondary | ICD-10-CM | POA: Diagnosis not present

## 2019-02-19 DIAGNOSIS — G43909 Migraine, unspecified, not intractable, without status migrainosus: Secondary | ICD-10-CM | POA: Diagnosis not present

## 2019-02-19 DIAGNOSIS — I69354 Hemiplegia and hemiparesis following cerebral infarction affecting left non-dominant side: Secondary | ICD-10-CM | POA: Diagnosis not present

## 2019-02-19 DIAGNOSIS — N3001 Acute cystitis with hematuria: Secondary | ICD-10-CM | POA: Diagnosis not present

## 2019-02-19 DIAGNOSIS — Z87440 Personal history of urinary (tract) infections: Secondary | ICD-10-CM | POA: Diagnosis not present

## 2019-02-19 DIAGNOSIS — K259 Gastric ulcer, unspecified as acute or chronic, without hemorrhage or perforation: Secondary | ICD-10-CM | POA: Diagnosis not present

## 2019-02-19 DIAGNOSIS — I69320 Aphasia following cerebral infarction: Secondary | ICD-10-CM | POA: Diagnosis not present

## 2019-02-19 DIAGNOSIS — M1990 Unspecified osteoarthritis, unspecified site: Secondary | ICD-10-CM | POA: Diagnosis not present

## 2019-02-19 DIAGNOSIS — E039 Hypothyroidism, unspecified: Secondary | ICD-10-CM | POA: Diagnosis not present

## 2019-02-19 DIAGNOSIS — Z7902 Long term (current) use of antithrombotics/antiplatelets: Secondary | ICD-10-CM | POA: Diagnosis not present

## 2019-02-20 DIAGNOSIS — R05 Cough: Secondary | ICD-10-CM | POA: Diagnosis not present

## 2019-02-25 DIAGNOSIS — D649 Anemia, unspecified: Secondary | ICD-10-CM | POA: Diagnosis not present

## 2019-03-03 DIAGNOSIS — E039 Hypothyroidism, unspecified: Secondary | ICD-10-CM | POA: Diagnosis not present

## 2019-03-03 DIAGNOSIS — K259 Gastric ulcer, unspecified as acute or chronic, without hemorrhage or perforation: Secondary | ICD-10-CM | POA: Diagnosis not present

## 2019-03-03 DIAGNOSIS — I69320 Aphasia following cerebral infarction: Secondary | ICD-10-CM | POA: Diagnosis not present

## 2019-03-03 DIAGNOSIS — Z7902 Long term (current) use of antithrombotics/antiplatelets: Secondary | ICD-10-CM | POA: Diagnosis not present

## 2019-03-03 DIAGNOSIS — G43909 Migraine, unspecified, not intractable, without status migrainosus: Secondary | ICD-10-CM | POA: Diagnosis not present

## 2019-03-03 DIAGNOSIS — N3001 Acute cystitis with hematuria: Secondary | ICD-10-CM | POA: Diagnosis not present

## 2019-03-03 DIAGNOSIS — I69354 Hemiplegia and hemiparesis following cerebral infarction affecting left non-dominant side: Secondary | ICD-10-CM | POA: Diagnosis not present

## 2019-03-03 DIAGNOSIS — M1990 Unspecified osteoarthritis, unspecified site: Secondary | ICD-10-CM | POA: Diagnosis not present

## 2019-03-03 DIAGNOSIS — Z87440 Personal history of urinary (tract) infections: Secondary | ICD-10-CM | POA: Diagnosis not present

## 2019-04-15 DIAGNOSIS — I69320 Aphasia following cerebral infarction: Secondary | ICD-10-CM | POA: Diagnosis not present

## 2019-04-15 DIAGNOSIS — M6281 Muscle weakness (generalized): Secondary | ICD-10-CM | POA: Diagnosis not present

## 2019-04-15 DIAGNOSIS — E034 Atrophy of thyroid (acquired): Secondary | ICD-10-CM | POA: Diagnosis not present

## 2019-06-10 DIAGNOSIS — J018 Other acute sinusitis: Secondary | ICD-10-CM | POA: Diagnosis not present

## 2019-06-10 DIAGNOSIS — Z1159 Encounter for screening for other viral diseases: Secondary | ICD-10-CM | POA: Diagnosis not present

## 2019-07-01 DIAGNOSIS — J3089 Other allergic rhinitis: Secondary | ICD-10-CM | POA: Diagnosis not present

## 2019-07-01 DIAGNOSIS — E038 Other specified hypothyroidism: Secondary | ICD-10-CM | POA: Diagnosis not present

## 2019-07-15 ENCOUNTER — Ambulatory Visit: Payer: Self-pay | Admitting: Allergy and Immunology

## 2019-07-22 ENCOUNTER — Ambulatory Visit: Payer: Self-pay | Admitting: Allergy and Immunology

## 2019-07-27 ENCOUNTER — Other Ambulatory Visit: Payer: Self-pay

## 2019-07-27 DIAGNOSIS — R7989 Other specified abnormal findings of blood chemistry: Secondary | ICD-10-CM

## 2019-07-29 ENCOUNTER — Ambulatory Visit: Payer: Self-pay | Admitting: Allergy and Immunology

## 2019-08-12 ENCOUNTER — Other Ambulatory Visit: Payer: Medicare Other

## 2019-08-12 ENCOUNTER — Other Ambulatory Visit: Payer: Self-pay

## 2019-08-12 DIAGNOSIS — R7989 Other specified abnormal findings of blood chemistry: Secondary | ICD-10-CM | POA: Diagnosis not present

## 2019-08-13 LAB — TSH: TSH: 1.55 u[IU]/mL (ref 0.450–4.500)

## 2019-08-27 ENCOUNTER — Ambulatory Visit: Payer: Medicare Other | Admitting: Physician Assistant

## 2019-09-05 ENCOUNTER — Other Ambulatory Visit: Payer: Self-pay | Admitting: Family Medicine

## 2019-09-15 ENCOUNTER — Other Ambulatory Visit: Payer: Self-pay | Admitting: Family Medicine

## 2019-09-24 ENCOUNTER — Other Ambulatory Visit: Payer: Self-pay | Admitting: Family Medicine

## 2019-09-28 ENCOUNTER — Other Ambulatory Visit: Payer: Self-pay

## 2019-09-28 ENCOUNTER — Telehealth: Payer: Self-pay

## 2019-09-28 MED ORDER — FUROSEMIDE 20 MG PO TABS: ORAL_TABLET | ORAL | 0 refills | Status: DC

## 2019-10-01 NOTE — Telephone Encounter (Signed)
error 

## 2019-10-06 ENCOUNTER — Encounter: Payer: Self-pay | Admitting: Family Medicine

## 2019-10-06 NOTE — Progress Notes (Signed)
Acute Office Visit  Subjective:    Patient ID: Veronica [name], female    DOB: 1936/04/06, 84 y.o.   MRN: [id number]  Chief Complaint  Patient presents with  . skin lesion    only on Buttocks x 5 days  . Edema    Lower legs and feet  . Home Health    Daughter states she needs home health for patient, expressed it is almost impossible to get patient here for her appointments.    HPI Patient is an 84 yo female with history of stroke and persistent sequela I of Broca's aphasia and right hemiparesis who presents today for evaluation of blistering lesions on buttock that are occurring in the mornings after she has soiled herself over night.  She also has ulcerations on her sacrum. She has been diligently using a barrier cream. Her daughter has been taking care of her full-time.  In the mornings when she wakes she tries to wash her as best she is able to and clean her soiled areas.  The patient requires maximum assistance for bathing, dressing, using the restroom (wears depends.)  She is able to feed herself with her left hand.  In addition she becomes very agitated when she has to go in the car anywhere including the doctor's office.  She frequently will find her daughter which consists of shaking and making loud noises which is obviously perceived as resistance.  They do not have a hospital bed.  They do not have an appropriate wheelchair.  Her daughter is unable to get her into their bathtub and so her bathing and for the last year and a half has consisted of sponge baths.  Her daughter is also watching her own 3 grandchildren during the day although the 82 year old is very helpful in taking care of Ms. Veronica [name].  She has not had home health care previously.  Her previous provider had been abrupt and apparently told Veronica [name]'s daughter McSchall that she would have to put her in a nursing home.  The patient's daughter is adamant to keep her at home.  She is a very poor experience with her  father in a nursing home and she refuses to put her mother through this.  She also has no CNA help.  Her husband works full-time and apparently uncomfortable helping with her mother. Has been diagnosed with vascular dementia with behavioral disturbance. She has a longstanding history of depression and is currently on Celexa. Her poor communication makes it nearly impossible to assess the extent of dementia.   Past Medical History:  Diagnosis Date  . Atrophy of thyroid   . CHF (congestive heart failure) (HCC)   . Depression   . Hypothyroidism   . Migraines   . Stroke [facility]) 2004  . Vascular dementia [facility])     Past Surgical History:  Procedure Laterality Date  . ABDOMINAL HYSTERECTOMY      Family History  Problem Relation Age of Onset  . Stroke Other   . Hypertension Other   . CAD Other   . Cancer Other     Social History   Socioeconomic History  . Marital status: Widowed    Spouse name: Not on file  . Number of children: 3  . Years of education: Not on file  . Highest education level: Not on file  Occupational History  . Not on file  Tobacco Use  . Smoking status: Never Smoker  . Smokeless tobacco: Never Used  Substance and Sexual Activity  .  Alcohol use: Never  . Drug use: Never  . Sexual activity: Not on file  Other Topics Concern  . Not on file  Social History Narrative  . Not on file   Social Determinants of Health   Financial Resource Strain:   . Difficulty of Paying Living Expenses:   Food Insecurity:   . Worried About Programme researcher, broadcasting/film/video in the Last Year:   . Barista in the Last Year:   Transportation Needs:   . Freight forwarder (Medical):   Marland Kitchen Lack of Transportation (Non-Medical):   Physical Activity:   . Days of Exercise per Week:   . Minutes of Exercise per Session:   Stress:   . Feeling of Stress :   Social Connections:   . Frequency of Communication with Friends and Family:   . Frequency of Social Gatherings with Friends and  Family:   . Attends Religious Services:   . Active Member of Clubs or Organizations:   . Attends Banker Meetings:   Marland Kitchen Marital Status:   Intimate Partner Violence:   . Fear of Current or Ex-Partner:   . Emotionally Abused:   Marland Kitchen Physically Abused:   . Sexually Abused:     Outpatient Medications Prior to Visit  Medication Sig Dispense Refill  . furosemide (LASIX) 20 MG tablet SMARTSIG:3 Tablet(s) By Mouth Every Other Day PRN 135 tablet 0  . levothyroxine (SYNTHROID) 125 MCG tablet TAKE 1 TABLET BY MOUTH EVERY DAY 90 tablet 0  . montelukast (SINGULAIR) 10 MG tablet TAKE 1 TABLET BY MOUTH EVERY DAY IN THE EVENING 90 tablet 1  . albuterol (VENTOLIN HFA) 108 (90 Base) MCG/ACT inhaler INHALE 1 - 2 PUFFS BY MOUTH EVERY 4 HOURS AS NEEDED 54 g 1  . citalopram (CELEXA) 20 MG tablet Take 20 mg by mouth daily.    . clopidogrel (PLAVIX) 75 MG tablet Take 75 mg by mouth daily.    Marland Kitchen dicyclomine (BENTYL) 10 MG capsule     . benzonatate (TESSALON) 100 MG capsule Take 1 capsule (100 mg total) by mouth 3 (three) times daily as needed for cough. 21 capsule 0  . fluticasone (FLONASE) 50 MCG/ACT nasal spray Place 1 spray into both nostrils daily. 16 g 0   No facility-administered medications prior to visit.    No Known Allergies  Review of Systems  Constitutional: Positive for chills (She is always cold). Negative for fever.  HENT: Positive for rhinorrhea. Negative for congestion, ear pain and sore throat.   Respiratory: Positive for shortness of breath (She seems to get short of breath when laying flat in bed.  Her daughter was concerned she might have fluid in her lungs.). Negative for cough.   Cardiovascular: Positive for leg swelling (This is an ongoing issue.  She does have Lasix.). Negative for chest pain.  Gastrointestinal: Negative for constipation, diarrhea and rectal pain.  Musculoskeletal: Positive for back pain (The daughter reports her mother does not seem to be in pain.).    Neurological: Positive for weakness.  Psychiatric/Behavioral: The patient is not nervous/anxious.        Objective:    Physical Exam Constitutional:      Appearance: Normal appearance.  Cardiovascular:     Rate and Rhythm: Normal rate and regular rhythm.     Heart sounds: Normal heart sounds.  Pulmonary:     Effort: Pulmonary effort is normal. No respiratory distress.     Breath sounds: Normal breath sounds. No rales.  Abdominal:  General: Bowel sounds are normal.     Palpations: Abdomen is soft.     Tenderness: There is no abdominal tenderness.  Musculoskeletal:        General: Normal range of motion.  Skin:    General: Skin is warm.       Neurological:     Mental Status: She is alert.     Motor: Weakness (rt arm and rt leg.) and abnormal muscle tone (rt arm/hand is contractured. ) present.     Comments: Clearly has a broca's aphasia. It is very difficult to understand what she is saying. She did become very frustrated and was pushing back when we tried to stand her up with my nurse and her daughter. She did it after a minute so I could view her buttocks.   Psychiatric:        Attention and Perception: Attention normal.        Mood and Affect: Affect is flat.        Speech: She is noncommunicative.        Behavior: Behavior is agitated (at one point. ).        Judgment: Judgment is inappropriate.     BP (!) 116/54   Pulse (!) 102   Temp 98.1 F (36.7 C)   Ht 4\' 11"  (1.499 m)   SpO2 95%  Wt Readings from Last 3 Encounters:  No data found for Wt    Health Maintenance Due  Topic Date Due  . COVID-19 Vaccine (1) Never done  . TETANUS/TDAP  Never done  . DEXA SCAN  Never done  . PNA vac Low Risk Adult (1 of 2 - PCV13) Never done    There are no preventive care reminders to display for this patient.   Lab Results  Component Value Date   TSH 1.550 08/12/2019   Lab Results  Component Value Date   WBC 7.8 01/31/2019   HGB 12.2 01/31/2019   HCT 38.3  01/31/2019   MCV 98.7 01/31/2019   PLT 274 01/31/2019   Lab Results  Component Value Date   NA 137 01/31/2019   K 3.7 01/31/2019   CO2 24 01/31/2019   GLUCOSE 115 (H) 01/31/2019   BUN 17 01/31/2019   CREATININE 0.84 01/31/2019   CALCIUM 8.9 01/31/2019   ANIONGAP 13 01/31/2019   No results found for: CHOL No results found for: HDL No results found for: LDLCALC No results found for: TRIG No results found for: CHOLHDL No results found for: HGBA1C     Assessment & Plan:  1. Aphasia as late effect of stroke - progressive. 2. Hemiparesis affecting right side as late effect of cerebrovascular accident ([place]) - stable. 3. Secondary hypothyroidism - stable. 4. Stage 3 skin ulcer of sacral region [facility]) not sure if truly worsening or fairly stable. 5. Vascular dementia with behavior disturbance (HCC) - progressive 6. Mild recurrent major depression (HCC) - stable.  Plan: We will order home health care (skilled nursing, and wound care nurse, social work, Occupational Therapy, and physical therapy. Unsure if she would benefit from speech therapy. I would like this assessed.)  Mrs. Stangl also needs a CNA and would likely benefit from at least 2 hours/day. The patient needs a hospital bed with a gel mat/air overlay to protect her from worsening bed sores.  She needs a new light weight wheelchair. She needs her shower/bath assessed for safety as well as usability for her.  She may need a specialized shower chair going over  the edge of the bathtub. She may benefit from a lift chair. She needs a Nurse, adult. I do not recommend any changes to her medication.  For the swelling in her lower extremities I would recommend elevation which will be significantly easier with a hospital bed.  I do not see any signs of neglect by her daughter rather significant devotion.  Her daughter appears exhausted and this family is desperate for help. Follow-up: Return in about 3 months (around 01/07/2020).  An After  Visit Summary was printed and given to the patient.  Blane Ohara [facility] [phone]

## 2019-10-08 ENCOUNTER — Other Ambulatory Visit: Payer: Self-pay

## 2019-10-08 ENCOUNTER — Ambulatory Visit: Payer: Medicare Other | Admitting: Family Medicine

## 2019-10-08 ENCOUNTER — Ambulatory Visit (INDEPENDENT_AMBULATORY_CARE_PROVIDER_SITE_OTHER): Payer: Medicare Other | Admitting: Family Medicine

## 2019-10-08 ENCOUNTER — Encounter: Payer: Self-pay | Admitting: Family Medicine

## 2019-10-08 VITALS — BP 116/54 | HR 102 | Temp 98.1°F | Ht 59.0 in

## 2019-10-08 DIAGNOSIS — L98429 Non-pressure chronic ulcer of back with unspecified severity: Secondary | ICD-10-CM | POA: Diagnosis not present

## 2019-10-08 DIAGNOSIS — I69351 Hemiplegia and hemiparesis following cerebral infarction affecting right dominant side: Secondary | ICD-10-CM | POA: Diagnosis not present

## 2019-10-08 DIAGNOSIS — F01518 Vascular dementia, unspecified severity, with other behavioral disturbance: Secondary | ICD-10-CM

## 2019-10-08 DIAGNOSIS — F0151 Vascular dementia with behavioral disturbance: Secondary | ICD-10-CM

## 2019-10-08 DIAGNOSIS — F330 Major depressive disorder, recurrent, mild: Secondary | ICD-10-CM

## 2019-10-08 DIAGNOSIS — E038 Other specified hypothyroidism: Secondary | ICD-10-CM | POA: Diagnosis not present

## 2019-10-08 DIAGNOSIS — I69320 Aphasia following cerebral infarction: Secondary | ICD-10-CM | POA: Diagnosis not present

## 2019-10-08 NOTE — Patient Instructions (Signed)
Plans: Order home health care.  Needs skilled nursing, hospital bed, hoyer lift, wheelchair.

## 2019-11-04 ENCOUNTER — Ambulatory Visit: Payer: Medicare Other | Admitting: Family Medicine

## 2019-12-09 ENCOUNTER — Other Ambulatory Visit: Payer: Self-pay | Admitting: Physician Assistant

## 2019-12-15 ENCOUNTER — Other Ambulatory Visit: Payer: Self-pay | Admitting: Family Medicine

## 2019-12-17 ENCOUNTER — Telehealth: Payer: Self-pay

## 2019-12-17 NOTE — Telephone Encounter (Signed)
Mrs. Vanstone daughter called to report that she is having some cough but no fever.  The Home Health nurse was out to visit yesterday and she did evaluate her lungs.  Her cough is productive on occasion with yellow sputum.  She was offered an appointment to bring her in for evaluation but she reports it is very difficult to get her in and out of the car.  She was instructed to call EMS and have her transported to the hospital if symptoms worsen or do not improve.

## 2019-12-18 ENCOUNTER — Other Ambulatory Visit: Payer: Self-pay

## 2019-12-18 NOTE — Telephone Encounter (Signed)
The Home Health Nurse called to report that she was out to visit Veronica [name] yesterday.  They are going to get PT out to evaluate and they are planning on a home health aide twice weekly.  Veronica [name] did have cough when they visited this week.  Marianne Sofia, PA approved labs to be drawn at their follow-up visit on Monday (CBC, CMP, and TSH).

## 2019-12-21 ENCOUNTER — Other Ambulatory Visit: Payer: Self-pay | Admitting: Legal Medicine

## 2019-12-22 DIAGNOSIS — E034 Atrophy of thyroid (acquired): Secondary | ICD-10-CM | POA: Diagnosis not present

## 2019-12-22 DIAGNOSIS — I69320 Aphasia following cerebral infarction: Secondary | ICD-10-CM | POA: Diagnosis not present

## 2019-12-22 DIAGNOSIS — I69351 Hemiplegia and hemiparesis following cerebral infarction affecting right dominant side: Secondary | ICD-10-CM | POA: Diagnosis not present

## 2019-12-22 DIAGNOSIS — S31819D Unspecified open wound of right buttock, subsequent encounter: Secondary | ICD-10-CM | POA: Diagnosis not present

## 2019-12-22 DIAGNOSIS — I509 Heart failure, unspecified: Secondary | ICD-10-CM | POA: Diagnosis not present

## 2019-12-22 DIAGNOSIS — E039 Hypothyroidism, unspecified: Secondary | ICD-10-CM | POA: Diagnosis not present

## 2019-12-23 DIAGNOSIS — R4701 Aphasia: Secondary | ICD-10-CM | POA: Diagnosis not present

## 2019-12-23 DIAGNOSIS — L89153 Pressure ulcer of sacral region, stage 3: Secondary | ICD-10-CM | POA: Diagnosis not present

## 2019-12-23 DIAGNOSIS — I69959 Hemiplegia and hemiparesis following unspecified cerebrovascular disease affecting unspecified side: Secondary | ICD-10-CM | POA: Diagnosis not present

## 2019-12-24 DIAGNOSIS — I69320 Aphasia following cerebral infarction: Secondary | ICD-10-CM | POA: Diagnosis not present

## 2019-12-24 DIAGNOSIS — I69351 Hemiplegia and hemiparesis following cerebral infarction affecting right dominant side: Secondary | ICD-10-CM | POA: Diagnosis not present

## 2019-12-24 DIAGNOSIS — E034 Atrophy of thyroid (acquired): Secondary | ICD-10-CM | POA: Diagnosis not present

## 2019-12-24 DIAGNOSIS — I509 Heart failure, unspecified: Secondary | ICD-10-CM | POA: Diagnosis not present

## 2019-12-24 DIAGNOSIS — S31819D Unspecified open wound of right buttock, subsequent encounter: Secondary | ICD-10-CM | POA: Diagnosis not present

## 2019-12-24 DIAGNOSIS — E039 Hypothyroidism, unspecified: Secondary | ICD-10-CM | POA: Diagnosis not present

## 2019-12-25 DIAGNOSIS — I509 Heart failure, unspecified: Secondary | ICD-10-CM | POA: Diagnosis not present

## 2019-12-25 DIAGNOSIS — I69320 Aphasia following cerebral infarction: Secondary | ICD-10-CM | POA: Diagnosis not present

## 2019-12-25 DIAGNOSIS — I69351 Hemiplegia and hemiparesis following cerebral infarction affecting right dominant side: Secondary | ICD-10-CM | POA: Diagnosis not present

## 2019-12-25 DIAGNOSIS — E039 Hypothyroidism, unspecified: Secondary | ICD-10-CM | POA: Diagnosis not present

## 2019-12-25 DIAGNOSIS — E034 Atrophy of thyroid (acquired): Secondary | ICD-10-CM | POA: Diagnosis not present

## 2019-12-25 DIAGNOSIS — S31819D Unspecified open wound of right buttock, subsequent encounter: Secondary | ICD-10-CM | POA: Diagnosis not present

## 2019-12-28 DIAGNOSIS — E034 Atrophy of thyroid (acquired): Secondary | ICD-10-CM | POA: Diagnosis not present

## 2019-12-28 DIAGNOSIS — I509 Heart failure, unspecified: Secondary | ICD-10-CM | POA: Diagnosis not present

## 2019-12-28 DIAGNOSIS — I69320 Aphasia following cerebral infarction: Secondary | ICD-10-CM | POA: Diagnosis not present

## 2019-12-28 DIAGNOSIS — I69351 Hemiplegia and hemiparesis following cerebral infarction affecting right dominant side: Secondary | ICD-10-CM | POA: Diagnosis not present

## 2019-12-28 DIAGNOSIS — E039 Hypothyroidism, unspecified: Secondary | ICD-10-CM | POA: Diagnosis not present

## 2019-12-28 DIAGNOSIS — S31819D Unspecified open wound of right buttock, subsequent encounter: Secondary | ICD-10-CM | POA: Diagnosis not present

## 2019-12-30 DIAGNOSIS — I69320 Aphasia following cerebral infarction: Secondary | ICD-10-CM | POA: Diagnosis not present

## 2019-12-30 DIAGNOSIS — E034 Atrophy of thyroid (acquired): Secondary | ICD-10-CM

## 2019-12-30 DIAGNOSIS — F0150 Vascular dementia without behavioral disturbance: Secondary | ICD-10-CM

## 2019-12-30 DIAGNOSIS — S31819D Unspecified open wound of right buttock, subsequent encounter: Secondary | ICD-10-CM | POA: Diagnosis not present

## 2019-12-30 DIAGNOSIS — I69351 Hemiplegia and hemiparesis following cerebral infarction affecting right dominant side: Secondary | ICD-10-CM | POA: Diagnosis not present

## 2019-12-30 DIAGNOSIS — E039 Hypothyroidism, unspecified: Secondary | ICD-10-CM | POA: Diagnosis not present

## 2019-12-30 DIAGNOSIS — I509 Heart failure, unspecified: Secondary | ICD-10-CM | POA: Diagnosis not present

## 2020-01-01 DIAGNOSIS — I69320 Aphasia following cerebral infarction: Secondary | ICD-10-CM | POA: Diagnosis not present

## 2020-01-01 DIAGNOSIS — E039 Hypothyroidism, unspecified: Secondary | ICD-10-CM | POA: Diagnosis not present

## 2020-01-01 DIAGNOSIS — I69351 Hemiplegia and hemiparesis following cerebral infarction affecting right dominant side: Secondary | ICD-10-CM | POA: Diagnosis not present

## 2020-01-01 DIAGNOSIS — E034 Atrophy of thyroid (acquired): Secondary | ICD-10-CM | POA: Diagnosis not present

## 2020-01-01 DIAGNOSIS — S31819D Unspecified open wound of right buttock, subsequent encounter: Secondary | ICD-10-CM | POA: Diagnosis not present

## 2020-01-01 DIAGNOSIS — I509 Heart failure, unspecified: Secondary | ICD-10-CM | POA: Diagnosis not present

## 2020-01-04 DIAGNOSIS — I509 Heart failure, unspecified: Secondary | ICD-10-CM | POA: Diagnosis not present

## 2020-01-04 DIAGNOSIS — I69351 Hemiplegia and hemiparesis following cerebral infarction affecting right dominant side: Secondary | ICD-10-CM | POA: Diagnosis not present

## 2020-01-04 DIAGNOSIS — E039 Hypothyroidism, unspecified: Secondary | ICD-10-CM | POA: Diagnosis not present

## 2020-01-04 DIAGNOSIS — S31819D Unspecified open wound of right buttock, subsequent encounter: Secondary | ICD-10-CM | POA: Diagnosis not present

## 2020-01-04 DIAGNOSIS — I69320 Aphasia following cerebral infarction: Secondary | ICD-10-CM | POA: Diagnosis not present

## 2020-01-04 DIAGNOSIS — E034 Atrophy of thyroid (acquired): Secondary | ICD-10-CM | POA: Diagnosis not present

## 2020-01-05 DIAGNOSIS — I69320 Aphasia following cerebral infarction: Secondary | ICD-10-CM | POA: Diagnosis not present

## 2020-01-05 DIAGNOSIS — E034 Atrophy of thyroid (acquired): Secondary | ICD-10-CM | POA: Diagnosis not present

## 2020-01-05 DIAGNOSIS — E039 Hypothyroidism, unspecified: Secondary | ICD-10-CM | POA: Diagnosis not present

## 2020-01-05 DIAGNOSIS — I509 Heart failure, unspecified: Secondary | ICD-10-CM | POA: Diagnosis not present

## 2020-01-05 DIAGNOSIS — I69351 Hemiplegia and hemiparesis following cerebral infarction affecting right dominant side: Secondary | ICD-10-CM | POA: Diagnosis not present

## 2020-01-05 DIAGNOSIS — S31819D Unspecified open wound of right buttock, subsequent encounter: Secondary | ICD-10-CM | POA: Diagnosis not present

## 2020-01-06 ENCOUNTER — Ambulatory Visit: Payer: Medicare Other | Admitting: Family Medicine

## 2020-01-07 ENCOUNTER — Telehealth: Payer: Self-pay

## 2020-01-07 DIAGNOSIS — I509 Heart failure, unspecified: Secondary | ICD-10-CM | POA: Diagnosis not present

## 2020-01-07 DIAGNOSIS — E039 Hypothyroidism, unspecified: Secondary | ICD-10-CM | POA: Diagnosis not present

## 2020-01-07 DIAGNOSIS — E034 Atrophy of thyroid (acquired): Secondary | ICD-10-CM | POA: Diagnosis not present

## 2020-01-07 DIAGNOSIS — I69351 Hemiplegia and hemiparesis following cerebral infarction affecting right dominant side: Secondary | ICD-10-CM | POA: Diagnosis not present

## 2020-01-07 DIAGNOSIS — S31819D Unspecified open wound of right buttock, subsequent encounter: Secondary | ICD-10-CM | POA: Diagnosis not present

## 2020-01-07 DIAGNOSIS — I69320 Aphasia following cerebral infarction: Secondary | ICD-10-CM | POA: Diagnosis not present

## 2020-01-07 NOTE — Telephone Encounter (Signed)
Left detailed vm informing patient per Dr. Sedalia Muta her liver, kidney and blood count were normal.

## 2020-01-11 DIAGNOSIS — I509 Heart failure, unspecified: Secondary | ICD-10-CM | POA: Diagnosis not present

## 2020-01-11 DIAGNOSIS — E039 Hypothyroidism, unspecified: Secondary | ICD-10-CM | POA: Diagnosis not present

## 2020-01-11 DIAGNOSIS — E034 Atrophy of thyroid (acquired): Secondary | ICD-10-CM | POA: Diagnosis not present

## 2020-01-11 DIAGNOSIS — S31819D Unspecified open wound of right buttock, subsequent encounter: Secondary | ICD-10-CM | POA: Diagnosis not present

## 2020-01-11 DIAGNOSIS — I69351 Hemiplegia and hemiparesis following cerebral infarction affecting right dominant side: Secondary | ICD-10-CM | POA: Diagnosis not present

## 2020-01-11 DIAGNOSIS — I69320 Aphasia following cerebral infarction: Secondary | ICD-10-CM | POA: Diagnosis not present

## 2020-01-13 ENCOUNTER — Other Ambulatory Visit: Payer: Self-pay

## 2020-01-13 ENCOUNTER — Encounter: Payer: Self-pay | Admitting: Family Medicine

## 2020-01-13 ENCOUNTER — Telehealth: Payer: Self-pay

## 2020-01-13 DIAGNOSIS — S31819D Unspecified open wound of right buttock, subsequent encounter: Secondary | ICD-10-CM | POA: Diagnosis not present

## 2020-01-13 DIAGNOSIS — R062 Wheezing: Secondary | ICD-10-CM

## 2020-01-13 DIAGNOSIS — I69351 Hemiplegia and hemiparesis following cerebral infarction affecting right dominant side: Secondary | ICD-10-CM | POA: Diagnosis not present

## 2020-01-13 DIAGNOSIS — I69320 Aphasia following cerebral infarction: Secondary | ICD-10-CM | POA: Diagnosis not present

## 2020-01-13 DIAGNOSIS — E034 Atrophy of thyroid (acquired): Secondary | ICD-10-CM | POA: Diagnosis not present

## 2020-01-13 DIAGNOSIS — I509 Heart failure, unspecified: Secondary | ICD-10-CM | POA: Diagnosis not present

## 2020-01-13 DIAGNOSIS — E039 Hypothyroidism, unspecified: Secondary | ICD-10-CM | POA: Diagnosis not present

## 2020-01-13 NOTE — Telephone Encounter (Signed)
Veronica [name] with home health called stating patient is congested and wheezing and is requesting a chest xray. Ok to order per Dr. Sedalia Muta. Order faxed to [phone]

## 2020-01-14 DIAGNOSIS — I69351 Hemiplegia and hemiparesis following cerebral infarction affecting right dominant side: Secondary | ICD-10-CM | POA: Diagnosis not present

## 2020-01-14 DIAGNOSIS — S31819D Unspecified open wound of right buttock, subsequent encounter: Secondary | ICD-10-CM | POA: Diagnosis not present

## 2020-01-14 DIAGNOSIS — I509 Heart failure, unspecified: Secondary | ICD-10-CM | POA: Diagnosis not present

## 2020-01-14 DIAGNOSIS — E034 Atrophy of thyroid (acquired): Secondary | ICD-10-CM | POA: Diagnosis not present

## 2020-01-14 DIAGNOSIS — I69320 Aphasia following cerebral infarction: Secondary | ICD-10-CM | POA: Diagnosis not present

## 2020-01-14 DIAGNOSIS — E039 Hypothyroidism, unspecified: Secondary | ICD-10-CM | POA: Diagnosis not present

## 2020-01-18 ENCOUNTER — Encounter: Payer: Self-pay | Admitting: Legal Medicine

## 2020-01-18 ENCOUNTER — Telehealth (INDEPENDENT_AMBULATORY_CARE_PROVIDER_SITE_OTHER): Payer: Medicare Other | Admitting: Legal Medicine

## 2020-01-18 VITALS — Temp 98.6°F

## 2020-01-18 DIAGNOSIS — J019 Acute sinusitis, unspecified: Secondary | ICD-10-CM | POA: Diagnosis not present

## 2020-01-18 DIAGNOSIS — J0100 Acute maxillary sinusitis, unspecified: Secondary | ICD-10-CM

## 2020-01-18 MED ORDER — AMOXICILLIN-POT CLAVULANATE 875-125 MG PO TABS: 1.0000 | ORAL_TABLET | Freq: Two times a day (BID) | ORAL | 0 refills | Status: DC

## 2020-01-18 NOTE — Progress Notes (Signed)
Virtual Visit via Telephone Note   This visit type was conducted due to national recommendations for restrictions regarding the COVID-19 Pandemic (e.g. social distancing) in an effort to limit this patient's exposure and mitigate transmission in our community.  Due to her co-morbid illnesses, this patient is at least at moderate risk for complications without adequate follow up.  This format is felt to be most appropriate for this patient at this time.  The patient did not have access to video technology/had technical difficulties with video requiring transitioning to audio format only (telephone).  All issues noted in this document were discussed and addressed.  No physical exam could be performed with this format.  Patient verbally consented to a telehealth visit.   Date:  01/18/2020   ID:  Veronica [name], DOB 1935/12/19, MRN [id number]  Patient Location: Home Provider Location: Office/Clinic  PCP:  Blane Ohara, MD   Evaluation Performed:  New Patient Evaluation  Chief Complaint:  Sinus drainage  History of Present Illness:    Veronica [name] is a 84 y.o. female with congestion and nasal pain.  Patient is severely demented and daughter spoke to me.  She is going on hospice.  No cough or fever.  The patient does not have symptoms concerning for COVID-19 infection (fever, chills, cough, or new shortness of breath).    Past Medical History:  Diagnosis Date   Atrophy of thyroid    CHF (congestive heart failure) (HCC)    Depression    Hypothyroidism    Migraines    Stroke (HCC) 2004   Vascular dementia [facility])     Past Surgical History:  Procedure Laterality Date   ABDOMINAL HYSTERECTOMY      Family History  Problem Relation Age of Onset   Stroke Other    Hypertension Other    CAD Other    Cancer Other     Social History   Socioeconomic History   Marital status: Widowed    Spouse name: Not on file   Number of children: 3   Years of education: Not  on file   Highest education level: Not on file  Occupational History   Not on file  Tobacco Use   Smoking status: Never Smoker   Smokeless tobacco: Never Used  Substance and Sexual Activity   Alcohol use: Never   Drug use: Never   Sexual activity: Not Currently  Other Topics Concern   Not on file  Social History Narrative   Not on file   Social Determinants of Health   Financial Resource Strain:    Difficulty of Paying Living Expenses:   Food Insecurity:    Worried About Programme researcher, broadcasting/film/video in the Last Year:    Barista in the Last Year:   Transportation Needs:    Freight forwarder (Medical):    Lack of Transportation (Non-Medical):   Physical Activity:    Days of Exercise per Week:    Minutes of Exercise per Session:   Stress:    Feeling of Stress :   Social Connections:    Frequency of Communication with Friends and Family:    Frequency of Social Gatherings with Friends and Family:    Attends Religious Services:    Active Member of Clubs or Organizations:    Attends Banker Meetings:    Marital Status:   Intimate Partner Violence:    Fear of Current or Ex-Partner:    Emotionally Abused:    Physically Abused:  Sexually Abused:     Outpatient Medications Prior to Visit  Medication Sig Dispense Refill   albuterol (VENTOLIN HFA) 108 (90 Base) MCG/ACT inhaler INHALE 1 - 2 PUFFS BY MOUTH EVERY 4 HOURS AS NEEDED 54 g 1   citalopram (CELEXA) 20 MG tablet TAKE 1 TABLET BY MOUTH  DAILY 90 tablet 3   clopidogrel (PLAVIX) 75 MG tablet TAKE 1 TABLET BY MOUTH  DAILY 90 tablet 3   dicyclomine (BENTYL) 10 MG capsule      furosemide (LASIX) 20 MG tablet TAKE 3 TABLETS BY MOUTH  EVERY OTHER DAY AS NEEDED 135 tablet 3   levothyroxine (SYNTHROID) 125 MCG tablet TAKE 1 TABLET BY MOUTH EVERY DAY 90 tablet 0   No facility-administered medications prior to visit.   .med Allergies:   Patient has no known allergies.    Social History   Tobacco Use   Smoking status: Never Smoker   Smokeless tobacco: Never Used  Substance Use Topics   Alcohol use: Never   Drug use: Never     Review of Systems  Constitutional: Negative.   HENT: Positive for congestion and sinus pain.   Eyes: Negative.   Respiratory: Negative.   Cardiovascular: Negative.   Gastrointestinal: Negative.   Genitourinary: Negative.   Musculoskeletal: Negative.   Skin: Negative.   Neurological: Negative.   Psychiatric/Behavioral: Negative.      Labs/Other Tests and Data Reviewed:    Recent Labs: 01/31/2019: BUN 17; Creatinine, Ser 0.84; Hemoglobin 12.2; Platelets 274; Potassium 3.7; Sodium 137 08/12/2019: TSH 1.550   Recent Lipid Panel No results found for: CHOL, TRIG, HDL, CHOLHDL, LDLCALC, LDLDIRECT  Wt Readings from Last 3 Encounters:  No data found for Wt     Objective:    Vital Signs:  Temp 98.6 F (37 C)    Physical Exam  VS reviewed ASSESSMENT & PLAN:   Diagnoses and all orders for this visit: Acute non-recurrent maxillary sinusitis -     amoxicillin-clavulanate (AUGMENTIN) 875-125 MG tablet; Take 1 tablet by mouth 2 (two) times daily. Acute non-recurrent sinusitis, unspecified location Patient treated with augmentin.  Patient will be under hospice care per caregiver.       COVID-19 Education: The signs and symptoms of COVID-19 were discussed with the patient and how to seek care for testing (follow up with PCP or arrange E-visit). The importance of social distancing was discussed today.  Time:   Today, I have spent 20 minutes with the patient with telehealth technology discussing the above problems.    Follow Up:  In Person prn  Signed, Brent Bulla, MD  01/18/2020 11:38 AM    [facility]

## 2020-01-23 DIAGNOSIS — L89153 Pressure ulcer of sacral region, stage 3: Secondary | ICD-10-CM | POA: Diagnosis not present

## 2020-01-23 DIAGNOSIS — R4701 Aphasia: Secondary | ICD-10-CM | POA: Diagnosis not present

## 2020-01-23 DIAGNOSIS — I69959 Hemiplegia and hemiparesis following unspecified cerebrovascular disease affecting unspecified side: Secondary | ICD-10-CM | POA: Diagnosis not present

## 2020-02-03 ENCOUNTER — Ambulatory Visit: Payer: Medicare Other | Admitting: Family Medicine

## 2020-02-18 ENCOUNTER — Other Ambulatory Visit: Payer: Self-pay | Admitting: Family Medicine

## 2020-02-18 ENCOUNTER — Telehealth: Payer: Self-pay

## 2020-02-18 MED ORDER — HYDROCODONE-ACETAMINOPHEN 5-325 MG PO TABS: 1.0000 | ORAL_TABLET | ORAL | 0 refills | Status: DC | PRN

## 2020-02-18 NOTE — Telephone Encounter (Signed)
Katie the Hospice Nurse called with concerns about Mrs. Veronica [name].  She has 4+ pitting edema and they suggested increasing lasix to 1 tablet bid x 3 days. She also has symptoms of a sinus infection and they are suggesting delsym 60 mg bid and claritin 10 mg bid.  They are suggesting Norco 5/325 1 every 4 hours prn to [facility].  Dr. Sedalia Muta approved medications as suggested.

## 2020-03-02 ENCOUNTER — Emergency Department (HOSPITAL_COMMUNITY): Payer: Medicare Other

## 2020-03-02 ENCOUNTER — Encounter (HOSPITAL_COMMUNITY): Payer: Self-pay

## 2020-03-02 ENCOUNTER — Inpatient Hospital Stay (HOSPITAL_COMMUNITY)
Admission: EM | Admit: 2020-03-02 | Discharge: 2020-03-18 | DRG: 177 | Disposition: A | Discharge status: Skilled Nursing Facility | Payer: Medicare Other | Attending: Internal Medicine | Admitting: Internal Medicine

## 2020-03-02 ENCOUNTER — Other Ambulatory Visit: Payer: Self-pay

## 2020-03-02 DIAGNOSIS — Z66 Do not resuscitate: Secondary | ICD-10-CM | POA: Diagnosis present

## 2020-03-02 DIAGNOSIS — J9601 Acute respiratory failure with hypoxia: Secondary | ICD-10-CM

## 2020-03-02 DIAGNOSIS — Z91018 Allergy to other foods: Secondary | ICD-10-CM | POA: Diagnosis not present

## 2020-03-02 DIAGNOSIS — Z79890 Hormone replacement therapy (postmenopausal): Secondary | ICD-10-CM

## 2020-03-02 DIAGNOSIS — J1282 Pneumonia due to coronavirus disease 2019: Secondary | ICD-10-CM | POA: Diagnosis not present

## 2020-03-02 DIAGNOSIS — Z91011 Allergy to milk products: Secondary | ICD-10-CM

## 2020-03-02 DIAGNOSIS — Z79899 Other long term (current) drug therapy: Secondary | ICD-10-CM

## 2020-03-02 DIAGNOSIS — R748 Abnormal levels of other serum enzymes: Secondary | ICD-10-CM | POA: Diagnosis present

## 2020-03-02 DIAGNOSIS — Z90710 Acquired absence of both cervix and uterus: Secondary | ICD-10-CM

## 2020-03-02 DIAGNOSIS — K5900 Constipation, unspecified: Secondary | ICD-10-CM | POA: Diagnosis not present

## 2020-03-02 DIAGNOSIS — R0902 Hypoxemia: Secondary | ICD-10-CM | POA: Diagnosis not present

## 2020-03-02 DIAGNOSIS — I69311 Memory deficit following cerebral infarction: Secondary | ICD-10-CM

## 2020-03-02 DIAGNOSIS — L89312 Pressure ulcer of right buttock, stage 2: Secondary | ICD-10-CM | POA: Diagnosis not present

## 2020-03-02 DIAGNOSIS — Z751 Person awaiting admission to adequate facility elsewhere: Secondary | ICD-10-CM

## 2020-03-02 DIAGNOSIS — E039 Hypothyroidism, unspecified: Secondary | ICD-10-CM | POA: Diagnosis present

## 2020-03-02 DIAGNOSIS — U071 COVID-19: Principal | ICD-10-CM | POA: Diagnosis present

## 2020-03-02 DIAGNOSIS — F0150 Vascular dementia without behavioral disturbance: Secondary | ICD-10-CM | POA: Diagnosis present

## 2020-03-02 DIAGNOSIS — I509 Heart failure, unspecified: Secondary | ICD-10-CM

## 2020-03-02 DIAGNOSIS — R7401 Elevation of levels of liver transaminase levels: Secondary | ICD-10-CM | POA: Diagnosis not present

## 2020-03-02 DIAGNOSIS — Z823 Family history of stroke: Secondary | ICD-10-CM | POA: Diagnosis not present

## 2020-03-02 DIAGNOSIS — E870 Hyperosmolality and hypernatremia: Secondary | ICD-10-CM | POA: Diagnosis present

## 2020-03-02 DIAGNOSIS — E860 Dehydration: Secondary | ICD-10-CM | POA: Diagnosis not present

## 2020-03-02 DIAGNOSIS — F32A Depression, unspecified: Secondary | ICD-10-CM | POA: Diagnosis present

## 2020-03-02 DIAGNOSIS — M2550 Pain in unspecified joint: Secondary | ICD-10-CM | POA: Diagnosis not present

## 2020-03-02 DIAGNOSIS — Z7902 Long term (current) use of antithrombotics/antiplatelets: Secondary | ICD-10-CM

## 2020-03-02 DIAGNOSIS — Z743 Need for continuous supervision: Secondary | ICD-10-CM | POA: Diagnosis not present

## 2020-03-02 DIAGNOSIS — M6282 Rhabdomyolysis: Secondary | ICD-10-CM | POA: Diagnosis present

## 2020-03-02 DIAGNOSIS — L8990 Pressure ulcer of unspecified site, unspecified stage: Secondary | ICD-10-CM | POA: Insufficient documentation

## 2020-03-02 DIAGNOSIS — Z8249 Family history of ischemic heart disease and other diseases of the circulatory system: Secondary | ICD-10-CM | POA: Diagnosis not present

## 2020-03-02 DIAGNOSIS — R059 Cough, unspecified: Secondary | ICD-10-CM | POA: Diagnosis not present

## 2020-03-02 DIAGNOSIS — E871 Hypo-osmolality and hyponatremia: Secondary | ICD-10-CM | POA: Diagnosis not present

## 2020-03-02 DIAGNOSIS — N179 Acute kidney failure, unspecified: Secondary | ICD-10-CM | POA: Diagnosis present

## 2020-03-02 DIAGNOSIS — Z7401 Bed confinement status: Secondary | ICD-10-CM | POA: Diagnosis not present

## 2020-03-02 DIAGNOSIS — R404 Transient alteration of awareness: Secondary | ICD-10-CM | POA: Diagnosis not present

## 2020-03-02 DIAGNOSIS — R509 Fever, unspecified: Secondary | ICD-10-CM | POA: Diagnosis not present

## 2020-03-02 DIAGNOSIS — R0602 Shortness of breath: Secondary | ICD-10-CM | POA: Diagnosis not present

## 2020-03-02 DIAGNOSIS — Z6841 Body Mass Index (BMI) 40.0 and over, adult: Secondary | ICD-10-CM

## 2020-03-02 DIAGNOSIS — J129 Viral pneumonia, unspecified: Secondary | ICD-10-CM

## 2020-03-02 DIAGNOSIS — R001 Bradycardia, unspecified: Secondary | ICD-10-CM | POA: Diagnosis not present

## 2020-03-02 DIAGNOSIS — F01518 Vascular dementia, unspecified severity, with other behavioral disturbance: Secondary | ICD-10-CM | POA: Diagnosis present

## 2020-03-02 LAB — CBC WITH DIFFERENTIAL/PLATELET
Abs Immature Granulocytes: 0.04 10*3/uL (ref 0.00–0.07)
Basophils Absolute: 0 10*3/uL (ref 0.0–0.1)
Basophils Relative: 0 %
Eosinophils Absolute: 0 10*3/uL (ref 0.0–0.5)
Eosinophils Relative: 0 %
HCT: 40 % (ref 36.0–46.0)
Hemoglobin: 12.4 g/dL (ref 12.0–15.0)
Immature Granulocytes: 1 %
Lymphocytes Relative: 23 %
Lymphs Abs: 2 10*3/uL (ref 0.7–4.0)
MCH: 30.8 pg (ref 26.0–34.0)
MCHC: 31 g/dL (ref 30.0–36.0)
MCV: 99.3 fL (ref 80.0–100.0)
Monocytes Absolute: 0.4 10*3/uL (ref 0.1–1.0)
Monocytes Relative: 5 %
Neutro Abs: 6.1 10*3/uL (ref 1.7–7.7)
Neutrophils Relative %: 71 %
Platelets: 162 10*3/uL (ref 150–400)
RBC: 4.03 MIL/uL (ref 3.87–5.11)
RDW: 14.5 % (ref 11.5–15.5)
WBC: 8.6 10*3/uL (ref 4.0–10.5)
nRBC: 0 % (ref 0.0–0.2)

## 2020-03-02 LAB — COMPREHENSIVE METABOLIC PANEL
ALT: 29 U/L (ref 0–44)
AST: 78 U/L — ABNORMAL HIGH (ref 15–41)
Albumin: 3.2 g/dL — ABNORMAL LOW (ref 3.5–5.0)
Alkaline Phosphatase: 46 U/L (ref 38–126)
Anion gap: 14 (ref 5–15)
BUN: 28 mg/dL — ABNORMAL HIGH (ref 8–23)
CO2: 27 mmol/L (ref 22–32)
Calcium: 8.6 mg/dL — ABNORMAL LOW (ref 8.9–10.3)
Chloride: 110 mmol/L (ref 98–111)
Creatinine, Ser: 1.46 mg/dL — ABNORMAL HIGH (ref 0.44–1.00)
GFR calc Af Amer: 38 mL/min — ABNORMAL LOW (ref >60)
GFR calc non Af Amer: 33 mL/min — ABNORMAL LOW (ref >60)
Glucose, Bld: 138 mg/dL — ABNORMAL HIGH (ref 70–99)
Potassium: 3.6 mmol/L (ref 3.5–5.1)
Sodium: 151 mmol/L — ABNORMAL HIGH (ref 135–145)
Total Bilirubin: 0.7 mg/dL (ref 0.3–1.2)
Total Protein: 9 g/dL — ABNORMAL HIGH (ref 6.5–8.1)

## 2020-03-02 LAB — TROPONIN I (HIGH SENSITIVITY)
Troponin I (High Sensitivity): 146 ng/L (ref <18)
Troponin I (High Sensitivity): 110 ng/L (ref <18)

## 2020-03-02 LAB — TRIGLYCERIDES: Triglycerides: 98 mg/dL (ref <150)

## 2020-03-02 LAB — SARS CORONAVIRUS 2 BY RT PCR (HOSPITAL ORDER, PERFORMED IN ~~LOC~~ HOSPITAL LAB): SARS Coronavirus 2: POSITIVE — AB

## 2020-03-02 LAB — BRAIN NATRIURETIC PEPTIDE: B Natriuretic Peptide: 167.7 pg/mL — ABNORMAL HIGH (ref 0.0–100.0)

## 2020-03-02 LAB — FERRITIN: Ferritin: 1082 ng/mL — ABNORMAL HIGH (ref 11–307)

## 2020-03-02 LAB — PROCALCITONIN: Procalcitonin: 0.12 ng/mL

## 2020-03-02 LAB — FIBRINOGEN: Fibrinogen: 355 mg/dL (ref 210–475)

## 2020-03-02 LAB — D-DIMER, QUANTITATIVE: D-Dimer, Quant: >20 ug/mL-FEU — ABNORMAL HIGH (ref 0.00–0.50)

## 2020-03-02 LAB — C-REACTIVE PROTEIN: CRP: 2.4 mg/dL — ABNORMAL HIGH (ref <1.0)

## 2020-03-02 LAB — LACTIC ACID, PLASMA
Lactic Acid, Venous: 1.4 mmol/L (ref 0.5–1.9)
Lactic Acid, Venous: 2.9 mmol/L (ref 0.5–1.9)
Lactic Acid, Venous: 3.1 mmol/L (ref 0.5–1.9)

## 2020-03-02 LAB — LACTATE DEHYDROGENASE: LDH: 329 U/L — ABNORMAL HIGH (ref 98–192)

## 2020-03-02 LAB — GLUCOSE, CAPILLARY: Glucose-Capillary: 264 mg/dL — ABNORMAL HIGH (ref 70–99)

## 2020-03-02 MED ORDER — DEXTROSE 5 % IV SOLN: INTRAVENOUS | Status: AC

## 2020-03-02 MED ORDER — DEXAMETHASONE SODIUM PHOSPHATE 10 MG/ML IJ SOLN
10.0000 mg | Freq: Once | INTRAMUSCULAR | Status: AC
  Administered 2020-03-02: 10 mg via INTRAVENOUS
  Filled 2020-03-02: qty 1

## 2020-03-02 MED ORDER — LEVOTHYROXINE SODIUM 25 MCG PO TABS
125.0000 ug | ORAL_TABLET | ORAL | Status: DC
  Administered 2020-03-03 – 2020-03-18 (×16): 125 ug via ORAL
  Filled 2020-03-02 (×17): qty 1

## 2020-03-02 MED ORDER — SODIUM CHLORIDE 0.9 % IV BOLUS
500.0000 mL | Freq: Once | INTRAVENOUS | Status: AC
  Administered 2020-03-02: 500 mL via INTRAVENOUS

## 2020-03-02 MED ORDER — HYDROCOD POLST-CPM POLST ER 10-8 MG/5ML PO SUER
5.0000 mL | Freq: Two times a day (BID) | ORAL | Status: DC | PRN
  Administered 2020-03-11: 5 mL via ORAL
  Filled 2020-03-02: qty 5

## 2020-03-02 MED ORDER — ONDANSETRON HCL 4 MG PO TABS: 4.0000 mg | ORAL_TABLET | Freq: Four times a day (QID) | ORAL | Status: DC | PRN

## 2020-03-02 MED ORDER — ACETAMINOPHEN 80 MG RE SUPP: 80.0000 mg | Freq: Once | RECTAL | Status: DC

## 2020-03-02 MED ORDER — ALBUTEROL SULFATE HFA 108 (90 BASE) MCG/ACT IN AERS
1.0000 | INHALATION_SPRAY | RESPIRATORY_TRACT | Status: DC | PRN
  Administered 2020-03-02: 2 via RESPIRATORY_TRACT
  Filled 2020-03-02: qty 6.7

## 2020-03-02 MED ORDER — SODIUM CHLORIDE 0.9 % IV SOLN: 100.0000 mg | Freq: Every day | INTRAVENOUS | Status: DC

## 2020-03-02 MED ORDER — SODIUM CHLORIDE 0.9 % IV SOLN
200.0000 mg | Freq: Once | INTRAVENOUS | Status: DC
  Filled 2020-03-02: qty 40

## 2020-03-02 MED ORDER — CLOPIDOGREL BISULFATE 75 MG PO TABS
75.0000 mg | ORAL_TABLET | Freq: Every day | ORAL | Status: DC
  Administered 2020-03-03 – 2020-03-18 (×16): 75 mg via ORAL
  Filled 2020-03-02 (×17): qty 1

## 2020-03-02 MED ORDER — ACETAMINOPHEN 325 MG PO TABS
650.0000 mg | ORAL_TABLET | Freq: Four times a day (QID) | ORAL | Status: DC | PRN
  Administered 2020-03-02 – 2020-03-18 (×3): 650 mg via ORAL
  Filled 2020-03-02 (×4): qty 2

## 2020-03-02 MED ORDER — SENNOSIDES-DOCUSATE SODIUM 8.6-50 MG PO TABS
1.0000 | ORAL_TABLET | Freq: Every evening | ORAL | Status: DC | PRN
  Administered 2020-03-08 – 2020-03-10 (×2): 1 via ORAL
  Filled 2020-03-02 (×4): qty 1

## 2020-03-02 MED ORDER — ACETAMINOPHEN 325 MG RE SUPP
325.0000 mg | Freq: Once | RECTAL | Status: AC
  Administered 2020-03-02: 325 mg via RECTAL
  Filled 2020-03-02: qty 1

## 2020-03-02 MED ORDER — GUAIFENESIN 100 MG/5ML PO SOLN
5.0000 mL | ORAL | Status: DC | PRN
  Administered 2020-03-02 – 2020-03-03 (×2): 100 mg via ORAL
  Filled 2020-03-02 (×3): qty 5

## 2020-03-02 MED ORDER — ACETAMINOPHEN 650 MG RE SUPP: 650.0000 mg | Freq: Four times a day (QID) | RECTAL | Status: DC | PRN

## 2020-03-02 MED ORDER — ENOXAPARIN SODIUM 60 MG/0.6ML ~~LOC~~ SOLN
45.0000 mg | SUBCUTANEOUS | Status: DC
  Administered 2020-03-02 – 2020-03-17 (×15): 45 mg via SUBCUTANEOUS
  Filled 2020-03-02 (×14): qty 0.6
  Filled 2020-03-02: qty 0.45
  Filled 2020-03-02: qty 0.6

## 2020-03-02 MED ORDER — ONDANSETRON HCL 4 MG/2ML IJ SOLN: 4.0000 mg | Freq: Four times a day (QID) | INTRAMUSCULAR | Status: DC | PRN

## 2020-03-02 MED ORDER — INSULIN ASPART 100 UNIT/ML ~~LOC~~ SOLN: 0.0000 [IU] | Freq: Three times a day (TID) | SUBCUTANEOUS | Status: DC

## 2020-03-02 MED ORDER — CITALOPRAM HYDROBROMIDE 20 MG PO TABS
20.0000 mg | ORAL_TABLET | Freq: Every day | ORAL | Status: DC
  Administered 2020-03-03 – 2020-03-18 (×16): 20 mg via ORAL
  Filled 2020-03-02 (×15): qty 1
  Filled 2020-03-02: qty 2
  Filled 2020-03-02: qty 1

## 2020-03-02 MED ORDER — SODIUM CHLORIDE 0.9 % IV BOLUS: 250.0000 mL | Freq: Once | INTRAVENOUS | Status: DC

## 2020-03-02 NOTE — ED Notes (Signed)
Pt not swallowing water. Swallow evaluation pending. PO meds not given at this time

## 2020-03-02 NOTE — ED Provider Notes (Signed)
[facility] EMERGENCY DEPARTMENT Provider Note   CSN: [id number] Arrival date & time: 03/02/20  1232     History Chief Complaint  Patient presents with  . COVID+  . Fever    Veronica [name] is a 84 y.o. female.  HPI  Level 5 CAVEAT 2/2 TO DEMENTIA 84 year old female with a history of CHF with unknown EF, currently on hospice, stroke with significant cognitive deficits, vascular dementia, hypothyroidism, depression presents to the ER with having tested positive for Covid approximately 10 days ago with it at home test, and increasing oxygen requirements.  History provided by the patient's daughter who is from I spoke with over the phone.  Patient states that her entire family has Covid presently, patient normally does not require home oxygen, however at approximately 2 weeks ago showing she started to show symptoms, she was placed on 1.5 L of oxygen at baseline.  She states over the last few days she has been requiring 2 to 3 L.  EMS found the patient to be hypoxic in the 80s on 2 L nasal cannula.  Patient's confusion is at baseline per patient's daughter.  She is currently on hospice care with DNR orders.  Here in the ED she is awake, will make eye contact and follow directions but with significant communication deficits.    Past Medical History:  Diagnosis Date  . Atrophy of thyroid   . CHF (congestive heart failure) (HCC)   . Depression   . Hypothyroidism   . Migraines   . Stroke [facility]) 2004  . Vascular dementia [facility])     Patient Active Problem List   Diagnosis Date Noted  . COVID-19 03/02/2020  . Acute sinusitis 01/18/2020    Past Surgical History:  Procedure Laterality Date  . ABDOMINAL HYSTERECTOMY       OB History   No obstetric history on file.     Family History  Problem Relation Age of Onset  . Stroke Other   . Hypertension Other   . CAD Other   . Cancer Other     Social History   Tobacco Use  . Smoking status: Never Smoker  .  Smokeless tobacco: Never Used  Substance Use Topics  . Alcohol use: Never  . Drug use: Never    Home Medications Prior to Admission medications   Medication Sig Start Date End Date Taking? Authorizing Provider  acetaminophen (TYLENOL) 500 MG tablet Take 500 mg by mouth every 6 (six) hours as needed for mild pain or headache.   Yes [provider]  albuterol (VENTOLIN HFA) 108 (90 Base) MCG/ACT inhaler INHALE 1 - 2 PUFFS BY MOUTH EVERY 4 HOURS AS NEEDED Patient taking differently: Inhale 1-2 puffs into the lungs every 4 (four) hours as needed for wheezing or shortness of breath.  09/06/19  Yes Cox, Kirsten, MD  Albuterol Sulfate 2.5 MG/0.5ML NEBU Inhale 3 mLs into the lungs 4 (four) times daily as needed for shortness of breath. 02/21/20  Yes [provider]  citalopram (CELEXA) 20 MG tablet TAKE 1 TABLET BY MOUTH  DAILY Patient taking differently: Take 20 mg by mouth daily.  12/15/19  Yes Marianne Sofia, PA-C  clopidogrel (PLAVIX) 75 MG tablet TAKE 1 TABLET BY MOUTH  DAILY Patient taking differently: Take 75 mg by mouth daily.  12/15/19  Yes Marianne Sofia, PA-C  [facility] COUGH DM 30 MG/5ML liquid Take 10 mLs by mouth 2 (two) times daily as needed for cough. 02/19/20  Yes [provider]  [facility] GENTLE LAXATIVE 10 MG suppository Place 10 mg rectally daily as needed for mild constipation.  02/29/20  Yes [provider]  furosemide (LASIX) 20 MG tablet Take 10-20 mg by mouth daily. Taking 20 mg in the morning and 1/2 tablet (10mg ) in the evening.   Yes [provider]  haloperidol (HALDOL) 0.5 MG tablet Take 0.5 mg by mouth See admin instructions. One tablet 4 to 5 times daily  ( Not to take with celexa) 03/01/20  Yes [provider]  HYDROcodone-acetaminophen (NORCO) 5-325 MG tablet Take 1 tablet by mouth every 4 (four) hours as needed for up to 14 days for moderate pain. 02/18/20 03/03/20 Yes Cox, Kirsten, MD  ibuprofen (ADVIL) 200 MG tablet Take 200 mg by mouth  every 6 (six) hours as needed for moderate pain.   Yes [provider]  ipratropium-albuterol (DUONEB) 0.5-2.5 (3) MG/3ML SOLN Inhale 3 mLs into the lungs every 4 (four) hours as needed for shortness of breath or wheezing. 01/28/20  Yes [provider]  levothyroxine (SYNTHROID) 125 MCG tablet TAKE 1 TABLET BY MOUTH EVERY DAY Patient taking differently: Take 125 mcg by mouth daily before breakfast.  12/10/19  Yes 02/10/20, PA-C  LORazepam (ATIVAN) 0.5 MG tablet Take 0.5 mg by mouth See admin instructions. 1 tablet 4 to 5 times daily 03/01/20  Yes [provider]  SENNA LAXATIVE 8.6 MG tablet Take 2 tablets by mouth 2 (two) times daily. 02/29/20  Yes [provider]  amoxicillin-clavulanate (AUGMENTIN) 875-125 MG tablet Take 1 tablet by mouth 2 (two) times daily. Patient not taking: Reported on 03/02/2020 01/18/20   03/19/20, MD  dicyclomine (BENTYL) 10 MG capsule Take 10 mg by mouth 3 (three) times daily before meals. On Hold 06/22/19   [provider]    Allergies    Cheese and Chocolate  Review of Systems   Review of Systems  Unable to perform ROS: Dementia    Physical Exam Updated Vital Signs BP (!) 113/59   Pulse 84   Temp (!) 102.2 F (39 C) (Rectal)   Resp (!) 25   Ht 4\' 11"  (1.499 m)   Wt 90.7 kg   SpO2 96%   BMI 40.40 kg/m   Physical Exam Vitals and nursing note reviewed.  Constitutional:      General: She is not in acute distress.    Appearance: She is well-developed. She is obese. She is ill-appearing.  HENT:     Head: Normocephalic and atraumatic.  Eyes:     Conjunctiva/sclera: Conjunctivae normal.  Cardiovascular:     Rate and Rhythm: Normal rate and regular rhythm.     Pulses: Normal pulses.     Heart sounds: Normal heart sounds. No murmur heard.   Pulmonary:     Effort: No respiratory distress.     Breath sounds: Wheezing and rhonchi present.     Comments: Tachypneic Abdominal:     Palpations:  Abdomen is soft.     Tenderness: There is no abdominal tenderness.  Musculoskeletal:        General: No tenderness.     Cervical back: Neck supple.  Skin:    General: Skin is warm and dry.     Findings: No erythema or rash.  Neurological:     Mental Status: She is alert.     Comments: Alert, will make eye contact and follow commands, communication limited      ED Results / Procedures / Treatments   Labs (all  labs ordered are listed, but only abnormal results are displayed) Labs Reviewed  COMPREHENSIVE METABOLIC PANEL - Abnormal; Notable for the following components:      Result Value   Sodium 151 (*)    Glucose, Bld 138 (*)    BUN 28 (*)    Creatinine, Ser 1.46 (*)    Calcium 8.6 (*)    Total Protein 9.0 (*)    Albumin 3.2 (*)    AST 78 (*)    GFR calc non Af Amer 33 (*)    GFR calc Af Amer 38 (*)    All other components within normal limits  D-DIMER, QUANTITATIVE (NOT AT [facility]) - Abnormal; Notable for the following components:   D-Dimer, Quant >20.00 (*)    All other components within normal limits  LACTATE DEHYDROGENASE - Abnormal; Notable for the following components:   LDH 329 (*)    All other components within normal limits  FERRITIN - Abnormal; Notable for the following components:   Ferritin 1,082 (*)    All other components within normal limits  C-REACTIVE PROTEIN - Abnormal; Notable for the following components:   CRP 2.4 (*)    All other components within normal limits  SARS CORONAVIRUS 2 BY RT PCR (HOSPITAL ORDER, PERFORMED IN [facility])  CULTURE, BLOOD (ROUTINE X 2)  CULTURE, BLOOD (ROUTINE X 2)  CBC WITH DIFFERENTIAL/PLATELET  LACTIC ACID, PLASMA  PROCALCITONIN  TRIGLYCERIDES  FIBRINOGEN  LACTIC ACID, PLASMA  CBC WITH DIFFERENTIAL/PLATELET  BRAIN NATRIURETIC PEPTIDE  TROPONIN I (HIGH SENSITIVITY)    EKG EKG Interpretation  Date/Time:  Wednesday March 02 2020 12:58:27 EDT Ventricular Rate:  92 PR Interval:    QRS  Duration: 74 QT Interval:  381 QTC Calculation: 472 R Axis:   -45 Text Interpretation: Sinus rhythm Inferior infarct, age indeterminate Consider anterior infarct similar to Aug 2020 Confirmed by Pricilla LovelessGoldston, Scott [phone]([id number]) on 03/02/2020 1:58:09 PM   Radiology DG Chest Port 1 View  Result Date: 03/02/2020 CLINICAL DATA:  Shortness of breath with recent positive COVID test. Fever. EXAM: PORTABLE CHEST 1 VIEW COMPARISON:  None. FINDINGS: Cardiac silhouette is accentuated by low lung volumes and portable technique. Calcific atherosclerosis of the aorta. Airspace opacity in the lateral left lung base. No pneumothorax. The visualized skeletal structures are unremarkable. IMPRESSION: New airspace opacity in the lateral left lung base, concerning for pneumonia given the clinical history. Electronically Signed   By: Feliberto HartsFrederick S Jones MD   On: 03/02/2020 13:22    Procedures Procedures (including critical care time)  Medications Ordered in ED Medications  dexamethasone (DECADRON) injection 10 mg (has no administration in time range)  remdesivir 200 mg in sodium chloride 0.9% 250 mL IVPB (has no administration in time range)    Followed by  remdesivir 100 mg in sodium chloride 0.9 % 100 mL IVPB (has no administration in time range)  sodium chloride 0.9 % bolus 250 mL (has no administration in time range)  acetaminophen (TYLENOL) suppository 325 mg (325 mg Rectal Given 03/02/20 1423)    ED Course  I have reviewed the triage vital signs and the nursing notes.  Pertinent labs & imaging results that were available during my care of the patient were reviewed by me and considered in my medical decision making (see chart for details).    MDM Rules/Calculators/A&P                         84 year old female with increasing O2 requirements  in the setting of COVID-19.  She is currently requiring 5 L of oxygen, with O2 sats at 96%.  Mental status at baseline per family, do not think she needs any additional CT  imaging.  I spoke with the patient's daughter who confirmed that she is a DNR and on hospice.  CBC without leukocytosis, normal hemoglobin.  CMP with mild hyponatremia of 151, appears to have evidence of an AKI with a creatinine 1.46 and a BUN of 28, AST elevation of 78 likely in the setting of COVID-19.  Chest x-ray with new airspace opacity in the lateral left lung concerning for pneumonia more likely viral given caretaking.  Patient was given Decadron and remdesivir. Gentle fluid rehydration given CHF w/ 500cc's for hypernatremia and AKI.  EKG unchanged from previous.  Consulted hospitalist team for admission and management.  They will have further discussions with the family for goals of care.  Remains hemodynamically stable here in the ED at this time.  Final Clinical Impression(s) / ED Diagnoses Final diagnoses:  Acute hypoxemic respiratory failure due to COVID-19 [facility])  Hypernatremia  AKI (acute kidney injury) (HCC)  Viral pneumonia    Rx / DC Orders ED Discharge Orders    None       Leone Brand 03/02/20 1522    Pricilla Loveless, MD 03/03/20 [phone]

## 2020-03-02 NOTE — ED Triage Notes (Signed)
Pt from home with [facility], tested positive for COVID on Saturday with a home test. Pt having fever and worsening generalized weakness. Pt is a hospice pt. Pt arrives to ed alert.

## 2020-03-02 NOTE — Progress Notes (Signed)
CRITICAL VALUE ALERT   Critical Value:  LACTIC ACID 2.9  Date & Time Notied:  03/02/20 20:52  Provider Notified: Intern: Ludwig Lean     Orders Received/Actions taken:

## 2020-03-02 NOTE — H&P (Addendum)
Date: 03/02/2020               Patient Name:  Veronica [name] MRN: [id number]  DOB: 12/30/1935 Age / Sex: 84 y.o., female   PCP: Blane Oharaox, Kirsten, MD         Medical Service: Internal Medicine Teaching Service         Attending Physician: Dr. Pricilla LovelessGoldston, Scott, MD    First Contact: Dr. Karilyn Cotaehman Pager: [phone]  Second Contact: Dr. Mikey BussingHoffman Pager: [phone]       After Hours (After 5p/  First Contact Pager: [phone]  weekends / holidays): Second Contact Pager: [phone]   Chief Complaint: Shortness of breath   History of Present Illness: Veronica [name] is an 84 yo female with a known past medical history of stroke, CHF with unknown ejection fraction, depression, and hypothyroidism presenting with worsening shortness of breath. History was obtained by EDP and family.   Patient tested positive for COVID-19 approximately 10 days ago for which she was also started on 1.5L supplemental oxygen. Her oxygen requirements continued to increase along with a worsening cough so she presented to the ED.  At baseline patient is unable to communicate and needs help with ADLs.  She was recently transitioned to hospice care approximately 3 weeks ago because the decline in her clinical status.  Daughter reports she stopped eating and can no longer her stand on her own.  Patient has never required supplemental oxygen at home.  She lives at home with her daughter.  Patient is not vaccinated.  Patient has difficulty hearing.  On arrival, patient was febrile Tmax 102.66F, saturating at 94% on 5L [place]. Vitals otherwise stable. Lactic acid 1.4, no leukocytosis. Cr 1.46. D-dimer >20, Ferritin 1082, CRP 2.4. Chest xray showed a left lung base airspace opacity.   Meds:  Current Meds  Medication Sig  . acetaminophen (TYLENOL) 500 MG tablet Take 500 mg by mouth every 6 (six) hours as needed for mild pain or headache.  . albuterol (VENTOLIN HFA) 108 (90 Base) MCG/ACT inhaler INHALE 1 - 2 PUFFS BY MOUTH EVERY 4 HOURS AS  NEEDED (Patient taking differently: Inhale 1-2 puffs into the lungs every 4 (four) hours as needed for wheezing or shortness of breath. )  . Albuterol Sulfate 2.5 MG/0.5ML NEBU Inhale 3 mLs into the lungs 4 (four) times daily as needed for shortness of breath.  . citalopram (CELEXA) 20 MG tablet TAKE 1 TABLET BY MOUTH  DAILY (Patient taking differently: Take 20 mg by mouth daily. )  . clopidogrel (PLAVIX) 75 MG tablet TAKE 1 TABLET BY MOUTH  DAILY (Patient taking differently: Take 75 mg by mouth daily. )  . [facility] COUGH DM 30 MG/5ML liquid Take 10 mLs by mouth 2 (two) times daily as needed for cough.  . [facility]S GENTLE LAXATIVE 10 MG suppository Place 10 mg rectally daily as needed for mild constipation.   . furosemide (LASIX) 20 MG tablet Take 10-20 mg by mouth daily. Taking 20 mg in the morning and 1/2 tablet (10mg ) in the evening.  . haloperidol (HALDOL) 0.5 MG tablet Take 0.5 mg by mouth See admin instructions. One tablet 4 to 5 times daily  ( Not to take with celexa)  . HYDROcodone-acetaminophen (NORCO) 5-325 MG tablet Take 1 tablet by mouth every 4 (four) hours as needed for up to 14 days for moderate pain.  Marland Kitchen. ibuprofen (ADVIL) 200 MG tablet Take 200 mg by mouth every 6 (six) hours as needed for moderate pain.  .Marland Kitchen  ipratropium-albuterol (DUONEB) 0.5-2.5 (3) MG/3ML SOLN Inhale 3 mLs into the lungs every 4 (four) hours as needed for shortness of breath or wheezing.  Marland Kitchen levothyroxine (SYNTHROID) 125 MCG tablet TAKE 1 TABLET BY MOUTH EVERY DAY (Patient taking differently: Take 125 mcg by mouth daily before breakfast. )  . LORazepam (ATIVAN) 0.5 MG tablet Take 0.5 mg by mouth See admin instructions. 1 tablet 4 to 5 times daily  . SENNA LAXATIVE 8.6 MG tablet Take 2 tablets by mouth 2 (two) times daily.     Allergies: Allergies as of 03/02/2020 - Review Complete 03/02/2020  Allergen Reaction Noted  . Cheese Other (See Comments) 03/02/2020  . Chocolate Other (See Comments) 03/02/2020   Past Medical  History:  Diagnosis Date  . Atrophy of thyroid   . CHF (congestive heart failure) (HCC)   . Depression   . Hypothyroidism   . Migraines   . Stroke [facility]) 2004  . Vascular dementia [facility]     Family History:   Family History  Problem Relation Age of Onset  . Stroke Other   . Hypertension Other   . CAD Other   . Cancer Other     Social History: Per daughter, no history of smoking, alcohol or illicit drug use. She lives at home with her daughter and is on hospice due to a decline in her clinical status.   Social History   Socioeconomic History  . Marital status: Widowed    Spouse name: Not on file  . Number of children: 3  . Years of education: Not on file  . Highest education level: Not on file  Occupational History  . Not on file  Tobacco Use  . Smoking status: Never Smoker  . Smokeless tobacco: Never Used  Substance and Sexual Activity  . Alcohol use: Never  . Drug use: Never  . Sexual activity: Not Currently  Other Topics Concern  . Not on file  Social History Narrative  . Not on file   Social Determinants of Health   Financial Resource Strain:   . Difficulty of Paying Living Expenses: Not on file  Food Insecurity:   . Worried About Programme researcher, broadcasting/film/video in the Last Year: Not on file  . Ran Out of Food in the Last Year: Not on file  Transportation Needs:   . Lack of Transportation (Medical): Not on file  . Lack of Transportation (Non-Medical): Not on file  Physical Activity:   . Days of Exercise per Week: Not on file  . Minutes of Exercise per Session: Not on file  Stress:   . Feeling of Stress : Not on file  Social Connections:   . Frequency of Communication with Friends and Family: Not on file  . Frequency of Social Gatherings with Friends and Family: Not on file  . Attends Religious Services: Not on file  . Active Member of Clubs or Organizations: Not on file  . Attends Banker Meetings: Not on file  . Marital Status: Not on file    Intimate Partner Violence:   . Fear of Current or Ex-Partner: Not on file  . Emotionally Abused: Not on file  . Physically Abused: Not on file  . Sexually Abused: Not on file    Review of Systems: A complete ROS was negative except as per HPI.   Physical Exam: Blood pressure (!) 113/59, pulse 84, temperature (!) 102.2 F (39 C), temperature source Rectal, resp. rate (!) 25, height 4\' 11"  (1.499 m), weight 90.7  kg, SpO2 96 %.  Physical Exam Constitutional:      General: She is not in acute distress.    Appearance: She is not ill-appearing, toxic-appearing or diaphoretic.     Comments: Seem comfortably resting in bed, unable to communicate or answer any questions  HENT:     Mouth/Throat:     Mouth: Mucous membranes are moist.     Pharynx: Oropharynx is clear. No oropharyngeal exudate or posterior oropharyngeal erythema.  Cardiovascular:     Rate and Rhythm: Normal rate and regular rhythm.     Pulses: Normal pulses.     Heart sounds: Normal heart sounds. No murmur heard.  No friction rub. No gallop.   Pulmonary:     Effort: Pulmonary effort is normal. No respiratory distress.     Breath sounds: Normal breath sounds. No stridor. No wheezing, rhonchi or rales.  Chest:     Chest wall: No tenderness.  Abdominal:     General: Bowel sounds are normal. There is no distension.     Palpations: Abdomen is soft.     Tenderness: There is no abdominal tenderness. There is no guarding.  Musculoskeletal:        General: Swelling present.     Right lower leg: Edema present.     Left lower leg: Edema present.     Comments: Non-pitting edema of b/l LE   Skin:    General: Skin is warm and dry.  Neurological:     Mental Status: Mental status is at baseline.     EKG: personally reviewed my interpretation is sinus rhythm, unchanged from prior  CXR: personally reviewed my interpretation is airspace opacity at the left lung base.  Assessment & Plan by Problem: Principal Problem:    COVID-9  84 year old female with history of CHF, stroke with significant cognitive deficits, hypothyroidism, and depression on hospice presenting with acute hypoxic respiratory failure after testing positive for Covid approximately 10 days ago. At baseline patient is unable to communicate, will make eye contact.  She was recently transitioned to hospice care approximately 3 weeks ago due to decline in clinical condition.  Acute hypoxic respiratory failure COVID-19 [facility] day 1.  Saturating 94% on 5 L nasal cannula.  Inflammatory markers elevated D-dimer greater than 20, ferritin 1082, CRP 2.4. Given elevated D-dimer and fever can consider CTA to rule out PE if patient remains febrile, develops tachycardia or any other s/sxs of PE/DVT.  AST 78, likely elevated in the setting of COVID-19.  Patient's vitals are stable and appears comfortable on exam.  Discussed goals of care with patient's daughter. She states patient has clinically deteriorated significantly over the past few months. Discussed current treatment options for COVID-19.  Would like to hold remdesivir due to patient's history of allergies.  Okay to proceed with steroids and CT imaging if deemed necessary.  - O2 saturation goal >90% - Discontinued remdesivir  - Dexamethasone day 1/5 - robitussin q4h prn, tussionex q12h prn for cough   AKI Creatinine 1.46, baseline below 1. Gentle hydration, 500 ml normal saline bolus ordered in the ED.   -Trend cmp. -Continue IVF after bolus   Hypernatremic Na 151. Will give D5W after bolus and trend CMP.   History of stroke Vascular dementia Per daughter, patient is at baseline unable to communicate.  Significant clinical d deterioration over the past few months recently transition to hospice care 3 weeks ago.  - SLP eval  CHF with unknown EF Home medication furosemide 20 mg daily, currently  hold.  BNP pending.  Elevated troponin Troponin 146.  EKG is unchanged from  prior, sinus rhythm.  Repeat troponin pending. Likely in the setting of stress from covid infection.   - trend trop  Depression Continue home medication citalopram 20 mg daily  Hypothyroidism Continue levothyroxine 125 mcg daily  VTE prophylaxis: Lovenox IVF:  NS, followed by D5W  Diet: NPO Code: DNR  Dispo: Admit patient to Inpatient with expected length of stay greater than 2 midnights.  Signed: Jaci Standard, DO 03/02/2020, 3:00 PM  Pager: [phone] After 5pm on weekdays and 1pm on weekends: On Call pager: [phone]

## 2020-03-03 LAB — GLUCOSE, CAPILLARY
Glucose-Capillary: 131 mg/dL — ABNORMAL HIGH (ref 70–99)
Glucose-Capillary: 209 mg/dL — ABNORMAL HIGH (ref 70–99)
Glucose-Capillary: 118 mg/dL — ABNORMAL HIGH (ref 70–99)
Glucose-Capillary: 164 mg/dL — ABNORMAL HIGH (ref 70–99)

## 2020-03-03 LAB — COMPREHENSIVE METABOLIC PANEL
ALT: 35 U/L (ref 0–44)
AST: 81 U/L — ABNORMAL HIGH (ref 15–41)
Albumin: 3 g/dL — ABNORMAL LOW (ref 3.5–5.0)
Alkaline Phosphatase: 39 U/L (ref 38–126)
Anion gap: 13 (ref 5–15)
BUN: 32 mg/dL — ABNORMAL HIGH (ref 8–23)
CO2: 25 mmol/L (ref 22–32)
Calcium: 8.1 mg/dL — ABNORMAL LOW (ref 8.9–10.3)
Chloride: 111 mmol/L (ref 98–111)
Creatinine, Ser: 1.13 mg/dL — ABNORMAL HIGH (ref 0.44–1.00)
GFR calc Af Amer: 52 mL/min — ABNORMAL LOW (ref >60)
GFR calc non Af Amer: 45 mL/min — ABNORMAL LOW (ref >60)
Glucose, Bld: 150 mg/dL — ABNORMAL HIGH (ref 70–99)
Potassium: 3.8 mmol/L (ref 3.5–5.1)
Sodium: 149 mmol/L — ABNORMAL HIGH (ref 135–145)
Total Bilirubin: 0.5 mg/dL (ref 0.3–1.2)
Total Protein: 8.3 g/dL — ABNORMAL HIGH (ref 6.5–8.1)

## 2020-03-03 LAB — CBC
HCT: 39.4 % (ref 36.0–46.0)
Hemoglobin: 12 g/dL (ref 12.0–15.0)
MCH: 30.3 pg (ref 26.0–34.0)
MCHC: 30.5 g/dL (ref 30.0–36.0)
MCV: 99.5 fL (ref 80.0–100.0)
Platelets: 133 10*3/uL — ABNORMAL LOW (ref 150–400)
RBC: 3.96 MIL/uL (ref 3.87–5.11)
RDW: 14.4 % (ref 11.5–15.5)
WBC: 7.9 10*3/uL (ref 4.0–10.5)
nRBC: 0 % (ref 0.0–0.2)

## 2020-03-03 LAB — LACTIC ACID, PLASMA
Lactic Acid, Venous: 2 mmol/L (ref 0.5–1.9)
Lactic Acid, Venous: 2.3 mmol/L (ref 0.5–1.9)

## 2020-03-03 LAB — D-DIMER, QUANTITATIVE: D-Dimer, Quant: >20 ug/mL-FEU — ABNORMAL HIGH (ref 0.00–0.50)

## 2020-03-03 LAB — FERRITIN: Ferritin: 1082 ng/mL — ABNORMAL HIGH (ref 11–307)

## 2020-03-03 LAB — C-REACTIVE PROTEIN: CRP: 1.9 mg/dL — ABNORMAL HIGH (ref <1.0)

## 2020-03-03 LAB — CK: Total CK: 1465 U/L — ABNORMAL HIGH (ref 38–234)

## 2020-03-03 MED ORDER — ENSURE ENLIVE PO LIQD
237.0000 mL | Freq: Three times a day (TID) | ORAL | Status: DC
  Administered 2020-03-03 – 2020-03-18 (×45): 237 mL via ORAL

## 2020-03-03 MED ORDER — DEXTROSE 5 % IV SOLN: INTRAVENOUS | Status: AC

## 2020-03-03 MED ORDER — ADULT MULTIVITAMIN W/MINERALS CH
1.0000 | ORAL_TABLET | Freq: Every day | ORAL | Status: DC
  Administered 2020-03-03 – 2020-03-18 (×16): 1 via ORAL
  Filled 2020-03-03 (×16): qty 1

## 2020-03-03 MED ORDER — SODIUM CHLORIDE 0.9 % IV BOLUS
500.0000 mL | Freq: Once | INTRAVENOUS | Status: AC
  Administered 2020-03-03: 500 mL via INTRAVENOUS

## 2020-03-03 NOTE — Progress Notes (Signed)
Initial Nutrition Assessment  DOCUMENTATION CODES:   Not applicable  INTERVENTION:    Ensure Enlive po TID, each supplement provides 350 kcal and 20 grams of protein  MVI daily   NUTRITION DIAGNOSIS:   Increased nutrient needs related to acute illness as evidenced by estimated needs.  GOAL:   Patient will meet greater than or equal to 90% of their needs  MONITOR:   PO intake, Supplement acceptance, Weight trends, Labs, I & O's  REASON FOR ASSESSMENT:   Consult Assessment of nutrition requirement/status  ASSESSMENT:   Patient with PMH significant for stroke, CHF, depression, and vascular dementia. Presents this admission with COVID 19 infection.   Of note, pt was enrolled in hospice care PTA.   Unable to obtain history from pt. Unsure of PO intake PTA. Diet advanced to DYS 1 with thin liquids per SLP. No meal completions documented. RD to provide supplementation to maximize kcal and protein this admission.   Weight history limited. Unsure of dry wt loss PTA.   Medications: reviewed  Labs: Na 149 (H) CBG 131-264  Diet Order:   Diet Order            DIET - DYS 1 Room service appropriate? No; Fluid consistency: Thin  Diet effective now                 EDUCATION NEEDS:   Not appropriate for education at this time  Skin:  Skin Assessment: Reviewed RN Assessment  Last BM:  PTA  Height:   Ht Readings from Last 1 Encounters:  03/02/20 4\' 11"  (1.499 m)    Weight:   Wt Readings from Last 1 Encounters:  03/02/20 90.7 kg    BMI:  Body mass index is 40.4 kg/m.  Estimated Nutritional Needs:   Kcal:  1600-1800 kcal  Protein:  80-95 grams  Fluid:  >/= 1.6 L/day   03/04/20 RD, LDN Clinical Nutrition Pager listed in AMION

## 2020-03-03 NOTE — NC FL2 (Signed)
[place] MEDICAID FL2 LEVEL OF CARE SCREENING TOOL     IDENTIFICATION  Patient Name: Veronica [name] Birthdate: 02-16-1936 Sex: female Admission Date (Current Location): 03/02/2020  [facility] and [place] Number:  [employer] and Address:  [facility] [place], [address] [place], [place] [address]      Provider Number: [id number]  Attending Physician Name and Address:  Gust Rung, DO  Relative Name and Phone Number:  Girard Cooter [phone]    Current Level of Care: Hospital Recommended Level of Care: Skilled Nursing Facility Prior Approval Number:    Date Approved/Denied:   PASRR Number:    Discharge Plan: Home    Current Diagnoses: Patient Active Problem List   Diagnosis Date Noted  . COVID-19 03/02/2020  . COVID-19 virus infection 03/02/2020  . Chronic CHF (congestive heart failure) (HCC) 03/02/2020  . Acute respiratory failure with hypoxemia (HCC) 03/02/2020  . Hypothyroidism 03/02/2020  . Hypernatremia 03/02/2020  . AKI (acute kidney injury) (HCC) 03/02/2020  . Vascular dementia (HCC)   . Acute sinusitis 01/18/2020    Orientation RESPIRATION BLADDER Height & Weight     Self  O2 (2L) Incontinent Weight: 90.7 kg Height:  4\' 11"  (149.9 cm)  BEHAVIORAL SYMPTOMS/MOOD NEUROLOGICAL BOWEL NUTRITION STATUS     (n/a) Incontinent Diet (Dysphagia/ thin liquids)  AMBULATORY STATUS COMMUNICATION OF NEEDS Skin   Total Care Verbally Normal                       Personal Care Assistance Level of Assistance  Bathing, Feeding, Dressing, Total care Bathing Assistance: Maximum assistance Feeding assistance: Maximum assistance Dressing Assistance: Maximum assistance Total Care Assistance: Maximum assistance   Functional Limitations Info  Sight, Hearing, Speech Sight Info: Adequate Hearing Info: Adequate Speech Info: Adequate    SPECIAL CARE FACTORS FREQUENCY  PT (By licensed PT), OT (By licensed OT)     PT Frequency:  5X OT Frequency: 5X            Contractures Contractures Info: Not present    Additional Factors Info  Code Status, Psychotropic, Allergies, Insulin Sliding Scale, Isolation Precautions, Suctioning Needs Code Status Info: DNR Allergies Info: cheese, chocolate Psychotropic Info: Hx of depression  see d/c summary for any psychotropic meds Insulin Sliding Scale Info: see d/c summary for any ss info Isolation Precautions Info: Airborne/ contact COVID positive patient Suctioning Needs: n/a   Current Medications (03/03/2020):  This is the current hospital active medication list Current Facility-Administered Medications  Medication Dose Route Frequency Provider Last Rate Last Admin  . acetaminophen (TYLENOL) suppository 650 mg  650 mg Rectal Q6H PRN Rehman, Areeg N, DO      . acetaminophen (TYLENOL) tablet 650 mg  650 mg Oral Q6H PRN Rehman, Areeg N, DO   650 mg at 03/02/20 2209  . albuterol (VENTOLIN HFA) 108 (90 Base) MCG/ACT inhaler 1-2 puff  1-2 puff Inhalation Q4H PRN Rehman, Areeg N, DO   2 puff at 03/02/20 1929  . chlorpheniramine-HYDROcodone (TUSSIONEX) 10-8 MG/5ML suspension 5 mL  5 mL Oral Q12H PRN Rehman, Areeg N, DO      . citalopram (CELEXA) tablet 20 mg  20 mg Oral Daily Rehman, Areeg N, DO   20 mg at 03/03/20 0810  . clopidogrel (PLAVIX) tablet 75 mg  75 mg Oral Daily Rehman, Areeg N, DO   75 mg at 03/03/20 0810  . dextrose 5 % solution   Intravenous Continuous Rehman, Areeg N, DO      .  enoxaparin (LOVENOX) injection 45 mg  45 mg Subcutaneous Q24H Rehman, Areeg N, DO   45 mg at 03/02/20 2209  . feeding supplement (ENSURE ENLIVE) (ENSURE ENLIVE) liquid 237 mL  237 mL Oral TID BM Hoffman, Erik C, DO      . guaiFENesin (ROBITUSSIN) 100 MG/5ML solution 100 mg  5 mL Oral Q4H PRN Rehman, Areeg N, DO   100 mg at 03/02/20 2209  . levothyroxine (SYNTHROID) tablet 125 mcg  125 mcg Oral Q24H Rehman, Areeg N, DO   125 mcg at 03/03/20 0607  . multivitamin with minerals tablet 1 tablet  1  tablet Oral Daily Hoffman, Erik C, DO      . ondansetron (ZOFRAN) tablet 4 mg  4 mg Oral Q6H PRN Rehman, Areeg N, DO       Or  . ondansetron (ZOFRAN) injection 4 mg  4 mg Intravenous Q6H PRN Rehman, Areeg N, DO      . senna-docusate (Senokot-S) tablet 1 tablet  1 tablet Oral QHS PRN Rehman, Areeg N, DO         Discharge Medications: Please see discharge summary for a list of discharge medications.  Relevant Imaging Results:  Relevant Lab Results:   Additional Information SSN [id number]   COVID positive patient  Beckie Busing, RN

## 2020-03-03 NOTE — TOC Initial Note (Addendum)
Transition of Care [facility]) - Initial/Assessment Note    Patient Details  Name: Veronica [name] MRN: [id number] Date of Birth: 08/01/1935  Transition of Care [facility]) CM/SW Contact:    Beckie Busing, RN Phone Number: [phone]  03/03/2020, 2:11 PM  Clinical Narrative:  CM consulted for families request for placement while daughter is recovering from covid.Patient is from home with daughter. Patient is on home hospice services. TOC consult has contact number for  [facility] [phone]. CM called Yvonna Alanis and Yvonna Alanis reports that she is the Child psychotherapist for hospice at home and that she has been discussing hospice respite stay with [facility]). Currently there is no definite admission plan but Yvonna Alanis states that she has spoken with  Haydee Monica who is the admissions coordinator. CM called Haydee Monica @[phone]. [phone] is unavailable, voicemail message has been left. CM will continue to follow and workup for placement.           1600 FL2 complete PASRR pending. Patient info has been faxed to [facility] in [place].        1610 Christie from [facility] called and confirmed that the facility is able to accept patient for a 5 day respite admit. [place] has to touch bases with hospice and will follow up with CM in am.    Expected Discharge Plan: Skilled Nursing Facility (For 5 day respite care) Barriers to Discharge: Continued Medical Work up   Patient Goals and CMS Choice Patient states their goals for this hospitalization and ongoing recovery are:: Patient is unable to communicate CMS Medicare.gov Compare Post Acute Care list provided to:: Patient Represenative (must comment) (daughter) Choice offered to / list presented to : Adult Children  Expected Discharge Plan and Services Expected Discharge Plan: Skilled Nursing Facility (For 5 day respite care) In-house Referral: NA Discharge Planning Services: CM Consult Post Acute Care Choice: Skilled Nursing  Facility Living arrangements for the past 2 months: Single Family Home                 DME Arranged: N/A DME Agency: NA       HH Arranged: NA HH Agency: NA        Prior Living Arrangements/Services Living arrangements for the past 2 months: Single Family Home Lives with:: Adult Children Patient language and need for interpreter reviewed:: Yes Do you feel safe going back to the place where you live?:  (unable to assess)      Need for Family Participation in Patient Care: Yes (Comment) Care giver support system in place?: Yes (comment) Current home services: Hospice Criminal Activity/Legal Involvement Pertinent to Current Situation/Hospitalization: No - Comment as needed  Activities of Daily Living      Permission Sought/Granted   Permission granted to share information with : No              Emotional Assessment Appearance::  (unabel to assess covid patient) Attitude/Demeanor/Rapport: Unable to Assess (unable to assess covid patient) Affect (typically observed): Unable to Assess Orientation: :  (unable to assess) Alcohol / Substance Use: Not Applicable Psych Involvement: No (comment)  Admission diagnosis:  Hypernatremia [E87.0] AKI (acute kidney injury) (HCC) [N17.9] Viral pneumonia [J12.9] Acute hypoxemic respiratory failure due to COVID-19 (HCC) [U07.1, J96.01] COVID-19 virus infection [U07.1] Patient Active Problem List   Diagnosis Date Noted  . COVID-19 03/02/2020  . COVID-19 virus infection 03/02/2020  . Chronic CHF (congestive heart failure) (HCC) 03/02/2020  . Acute respiratory failure with hypoxemia (HCC) 03/02/2020  .  Hypothyroidism 03/02/2020  . Hypernatremia 03/02/2020  . AKI (acute kidney injury) (HCC) 03/02/2020  . Vascular dementia (HCC)   . Acute sinusitis 01/18/2020   PCP:  Blane Ohara, MD Pharmacy:   [phone] [place] [place] - [address] [place] [address] Phone: [phone] Fax:  [phone]  [facility] [place], [place] - [address] [place], [address] [place], [address] [place] [address] Phone: [phone] Fax: [phone]     Social Determinants of Health (SDOH) Interventions    Readmission Risk Interventions No flowsheet data found.

## 2020-03-03 NOTE — Progress Notes (Signed)
   Subjective: Patient unable to communicate due to hx of stroke and underlying dementia. However, more alert on exam today.  Objective:  Vital signs in last 24 hours: Vitals:   03/02/20 1626 03/02/20 1840 03/02/20 2224 03/03/20 0805  BP: 118/61 (!) 144/85 125/88 132/85  Pulse: 77 74 77 66  Resp: (!) 25  16 20   Temp: 97.6 F (36.4 C) 98.7 F (37.1 C) 97.7 F (36.5 C) (!) 97.1 F (36.2 C)  TempSrc: Oral Oral Oral Axillary  SpO2: 94% 98% 90% 97%  Weight:      Height:        Supplemental Oxygen: 97% on 2L [place]  Constitution: NAD, appears comfortable on exam, more alert today  Cardio: RRR, no m/r/g, b/l LE non-pitting edema  Respiratory: diminished breath sounds bilaterally, no w/r/r Abdominal: NTTP, soft, non-distended MSK: moving all extremities, b/l LE tender to palpation Neuro: not alert or oriented to person place or time Skin: c/d/i   Assessment/Plan:  Principal Problem:   COVID-19 Active Problems:   COVID-19 virus infection   Vascular dementia (HCC)   Chronic CHF (congestive heart failure) (HCC)   Acute respiratory failure with hypoxemia (HCC)   Hypothyroidism   Hypernatremia   AKI (acute kidney injury) (HCC)  84 year old female with history of CHF, stroke with significant cognitive deficits, hypothyroidism, and depression on hospice presenting with acute hypoxic respiratory failure after testing positive for Covid approximately 10 days ago. At baseline patient is unable to communicate, will make eye contact.  She was recently transitioned to hospice care approximately 3 weeks ago due to decline in clinical condition.  Acute hypoxic respiratory failure COVID-19 [facility] Hospital day 2.  Saturating 97% on 2L nasal cannula.  CRP and lactic acid trending down, ferritin and D-dimer remain elevated. CK elevated in the setting of isolated AST elevation likely reflects muscle breakdown in the setting of dehydration and infection. Overall improved today.    Re-discussed goals of care with patient's daughter. She states the plan was to have her admitted to SNF for ~5 days until she was able to care of her; she also tested positive for covid. She intends to continue home hospice after she recovers from covid and is able to care of her mother. She agrees to continue current treatment with supplemental oxygen, dexamethasone and IVF fluids as necessary. Will consult TOC to help with SNF placement.   - O2 saturation goal >90% - Dexamethasone day 2/5 - robitussin q4h prn, tussionex q12h prn for cough   AKI Hypernatremia Creatinine 1.1, improved. Na 149. Free water deficit ~2.4L. Continue gentle hydration with D5W.   - trend cmp - continue D5W  CHF with unknown EF Home medication furosemide 20 mg daily, currently hold.  BNP mildly elevated. Appears euvolemic, mild b/l LE non-pitting edema.   - continue to hold lasix   Elevated troponin Repeat trop 110. On telemetry.    VTE prophylaxis: Lovenox IVF:  D5W  Diet: Soft Code: DNR  Dispo: Anticipated discharge pending SNF/hospice placement.   Myliyah Rebuck N, DO 03/03/2020, 9:32 AM Pager: [phone] After 5pm on weekdays and 1pm on weekends: On Call Pager: [phone]

## 2020-03-03 NOTE — Evaluation (Signed)
Clinical/Bedside Swallow Evaluation Patient Details  Name: Veronica [name] MRN: [id number] Date of Birth: [birth year]  Today's Date: 03/03/2020 Time: SLP Start Time (ACUTE ONLY): 0913 SLP Stop Time (ACUTE ONLY): 0934 SLP Time Calculation (min) (ACUTE ONLY): 21 min  Past Medical History:  Past Medical History:  Diagnosis Date  . Atrophy of thyroid   . CHF (congestive heart failure) (HCC)   . Depression   . Hypothyroidism   . Migraines   . Stroke [facility]) 2004  . Vascular dementia [facility]    Past Surgical History:  Past Surgical History:  Procedure Laterality Date  . ABDOMINAL HYSTERECTOMY     HPI:  Veronica [name] is an 84 yo female with a known past medical history of stroke, CHF, vascular dementia, depression, and hypothyroidism presenting with worsening shortness of breath. Tested positive for COVID-19 approximately 10 days ago. Per chart at baseline patient is unable to communicate, needs help with ADLs, recently transitioned to hospice care and dtr reports she has  stopped eating and can no longer her stand on her own.     Assessment / Plan / Recommendation Clinical Impression  Pt self feeding when arrived for swallow assessment with findings of oral dysphagia. She is endentulous, no dentures present impairments include left buccal cavity pocketing (mild), prolonged mastication and expectoration of pancake observed. Pharyngeal integrity appeared intact from subjective opinion only as no s/s aspiration. Suspect she will have improved manipulation, less pocketing with a puree texture, continue thin liquids, crush pills, recommend full assist to manage strategies (remove pocketing and from cognitive standpoint to ensure nutrition). This hospitalization she should remain on puree and once discharged can be assessed for higher texture if appropriate- ST will sign off at this time.      SLP Visit Diagnosis: Dysphagia, unspecified (R13.10)    Aspiration Risk  Mild aspiration risk    Diet  Recommendation Dysphagia 1 (Puree);Thin liquid   Liquid Administration via: Cup;Straw Medication Administration: Crushed with puree Supervision: Patient able to self feed;Staff to assist with self feeding;Full supervision/cueing for compensatory strategies Compensations: Slow rate;Small sips/bites;Lingual sweep for clearance of pocketing Postural Changes: Seated upright at 90 degrees    Other  Recommendations Oral Care Recommendations: Oral care BID   Follow up Recommendations None      Frequency and Duration            Prognosis Prognosis for Safe Diet Advancement: Fair Barriers to Reach Goals: Cognitive deficits      Swallow Study   General HPI: [name] is an 84 yo female with a known past medical history of stroke, CHF, vascular dementia, depression, and hypothyroidism presenting with worsening shortness of breath. Tested positive for COVID-19 approximately 10 days ago. Per chart at baseline patient is unable to communicate, needs help with ADLs, recently transitioned to hospice care and dtr reports she has  stopped eating and can no longer her stand on her own.   Type of Study: Bedside Swallow Evaluation Previous Swallow Assessment:  (none) Diet Prior to this Study: Thin liquids;Other (Comment) (soft) Temperature Spikes Noted: No Respiratory Status: Nasal cannula History of Recent Intubation: No Behavior/Cognition: Alert;Cooperative;Pleasant mood;Confused;Distractible;Requires cueing Oral Cavity Assessment: Other (comment) (mild pocketed food) Oral Care Completed by SLP: Other (Comment) (removed food residue) Oral Cavity - Dentition: Edentulous Vision: Functional for self-feeding Self-Feeding Abilities: Needs assist Patient Positioning: Upright in bed Baseline Vocal Quality: Normal Volitional Cough: Cognitively unable to elicit Volitional Swallow: Unable to elicit    Oral/Motor/Sensory Function Overall Oral Motor/Sensory Function:  (no  obvious deficits)   Ice  Chips Ice chips: Not tested   Thin Liquid Thin Liquid: Within functional limits Presentation: Straw    Nectar Thick Nectar Thick Liquid: Not tested   Honey Thick Honey Thick Liquid: Not tested   Puree Puree: Within functional limits   Solid     Solid: Impaired Oral Phase Impairments: Poor awareness of bolus Oral Phase Functional Implications: Left lateral sulci pocketing      [name] 03/03/2020,10:03 AM  Breck Coons Lonell Face.Ed Nurse, children's [phone] Office [phone]

## 2020-03-04 DIAGNOSIS — L8990 Pressure ulcer of unspecified site, unspecified stage: Secondary | ICD-10-CM | POA: Insufficient documentation

## 2020-03-04 LAB — CBC
HCT: 37 % (ref 36.0–46.0)
Hemoglobin: 11.8 g/dL — ABNORMAL LOW (ref 12.0–15.0)
MCH: 30.6 pg (ref 26.0–34.0)
MCHC: 31.9 g/dL (ref 30.0–36.0)
MCV: 96.1 fL (ref 80.0–100.0)
Platelets: 158 10*3/uL (ref 150–400)
RBC: 3.85 MIL/uL — ABNORMAL LOW (ref 3.87–5.11)
RDW: 14 % (ref 11.5–15.5)
WBC: 16.4 10*3/uL — ABNORMAL HIGH (ref 4.0–10.5)
nRBC: 0 % (ref 0.0–0.2)

## 2020-03-04 LAB — COMPREHENSIVE METABOLIC PANEL
ALT: 39 U/L (ref 0–44)
AST: 59 U/L — ABNORMAL HIGH (ref 15–41)
Albumin: 2.9 g/dL — ABNORMAL LOW (ref 3.5–5.0)
Alkaline Phosphatase: 45 U/L (ref 38–126)
Anion gap: 10 (ref 5–15)
BUN: 39 mg/dL — ABNORMAL HIGH (ref 8–23)
CO2: 25 mmol/L (ref 22–32)
Calcium: 8.3 mg/dL — ABNORMAL LOW (ref 8.9–10.3)
Chloride: 105 mmol/L (ref 98–111)
Creatinine, Ser: 0.96 mg/dL (ref 0.44–1.00)
GFR calc Af Amer: >60 mL/min (ref >60)
GFR calc non Af Amer: 55 mL/min — ABNORMAL LOW (ref >60)
Glucose, Bld: 182 mg/dL — ABNORMAL HIGH (ref 70–99)
Potassium: 3.9 mmol/L (ref 3.5–5.1)
Sodium: 140 mmol/L (ref 135–145)
Total Bilirubin: 0.5 mg/dL (ref 0.3–1.2)
Total Protein: 7.9 g/dL (ref 6.5–8.1)

## 2020-03-04 LAB — C-REACTIVE PROTEIN: CRP: 2 mg/dL — ABNORMAL HIGH (ref <1.0)

## 2020-03-04 LAB — GLUCOSE, CAPILLARY
Glucose-Capillary: 99 mg/dL (ref 70–99)
Glucose-Capillary: 148 mg/dL — ABNORMAL HIGH (ref 70–99)
Glucose-Capillary: 180 mg/dL — ABNORMAL HIGH (ref 70–99)
Glucose-Capillary: 171 mg/dL — ABNORMAL HIGH (ref 70–99)

## 2020-03-04 LAB — D-DIMER, QUANTITATIVE: D-Dimer, Quant: 14.63 ug/mL-FEU — ABNORMAL HIGH (ref 0.00–0.50)

## 2020-03-04 LAB — FERRITIN: Ferritin: 949 ng/mL — ABNORMAL HIGH (ref 11–307)

## 2020-03-04 MED ORDER — DEXAMETHASONE SODIUM PHOSPHATE 10 MG/ML IJ SOLN
6.0000 mg | Freq: Every day | INTRAMUSCULAR | Status: DC
  Administered 2020-03-04 – 2020-03-10 (×7): 6 mg via INTRAVENOUS
  Filled 2020-03-04 (×7): qty 1

## 2020-03-04 MED ORDER — DEXTROSE 5 % IV SOLN: INTRAVENOUS | Status: AC

## 2020-03-04 NOTE — TOC Progression Note (Signed)
Transition of Care [facility]) - Progression Note    Patient Details  Name: ROSELAND BRAUN MRN: [id number] Date of Birth: 16-Oct-1935  Transition of Care [facility]) CM/SW Contact  Beckie Busing, RN Phone Number: [phone]  03/04/2020, 12:50 PM  Clinical Narrative:   CM called Haydee Monica Adm Coordinator for [facility] to determine if patient can be admitted today for respite care. Lorene Dy confirms that the facility will not be able to take a covid positive patient. The patient would need to be 21 days  post covid positive test. Lorene Dy also states that the [facility] [phone]) is following up with family to determine if the patient truly needs respite vs long term care. CM will await to hear from Hospice [facility] for follow up.     Expected Discharge Plan: Skilled Nursing Facility (For 5 day respite care) Barriers to Discharge: Continued Medical Work up  Expected Discharge Plan and Services Expected Discharge Plan: Skilled Nursing Facility (For 5 day respite care) In-house Referral: NA Discharge Planning Services: CM Consult Post Acute Care Choice: Skilled Nursing Facility Living arrangements for the past 2 months: Single Family Home                 DME Arranged: N/A DME Agency: NA       HH Arranged: NA HH Agency: NA         Social Determinants of Health (SDOH) Interventions    Readmission Risk Interventions No flowsheet data found.

## 2020-03-04 NOTE — Progress Notes (Addendum)
PHARMACY - PHYSICIAN COMMUNICATION CRITICAL VALUE ALERT - BLOOD CULTURE IDENTIFICATION (BCID)  Veronica [name] is an 84 y.o. female who presented to [facility] on 03/02/2020 with a chief complaint of hypoxia in the setting of COVID.   Assessment:  Bcx growing 1/3 aerobic bottles GPR, likely contaminant. WBC wnl. Tmax 102.2 on admit, afebrile since. Per MD note, planning for hospice care.   Name of physician (or Provider) Contacted:  Dr. Kirke Corin  Current antibiotics: none  Changes to prescribed antibiotics recommended:  No changes recommended  No results found for this or any previous visit.   Alphia Moh, PharmD, BCPS, BCCP Clinical Pharmacist  Please check AMION for all [facility] phone numbers After 10:00 PM, call Main Pharmacy [phone]

## 2020-03-04 NOTE — TOC Progression Note (Signed)
Transition of Care [facility]) - Progression Note    Patient Details  Name: Veronica [name] MRN: [id number] Date of Birth: [birth year]  Transition of Care [facility]) CM/SW Contact  Beckie Busing, RN Phone Number: [phone]  03/04/2020, 10:23 AM  Clinical Narrative:   RE: Veronica [name] Date of Birth:Dec 21, 1935 Date:03/04/2020     To whom it may concern: Please be advised that the above- named patient will require a short- term nursing home stay- anticipated 30 days or less for rehabilitation and Strengthening. The plan is to return home.       Expected Discharge Plan: Skilled Nursing Facility (For 5 day respite care) Barriers to Discharge: Continued Medical Work up  Expected Discharge Plan and Services Expected Discharge Plan: Skilled Nursing Facility (For 5 day respite care) In-house Referral: NA Discharge Planning Services: CM Consult Post Acute Care Choice: Skilled Nursing Facility Living arrangements for the past 2 months: Single Family Home                 DME Arranged: N/A DME Agency: NA       HH Arranged: NA HH Agency: NA         Social Determinants of Health (SDOH) Interventions    Readmission Risk Interventions No flowsheet data found.

## 2020-03-04 NOTE — Discharge Summary (Addendum)
Name: Veronica [name] MRN: [id number] DOB: 1935-10-08 84 y.o. PCP: Veronica Ohara, MD  Date of Admission: 03/02/2020 12:32 PM Date of Discharge: 03/18/2020 Attending Physician: Earl Lagos, MD  Discharge Diagnosis: 1.  COVID-19 2.  Acute hypoxic respiratory failure 3.  Hypernatremia 4.  AKI 5.  Vascular dementia 6.  Chronic congestive heart failure  Discharge Medications: Allergies as of 03/18/2020      Reactions   Cheese Other (See Comments)   headache   Chocolate Other (See Comments)   headache      Medication List    STOP taking these medications   amoxicillin-clavulanate 875-125 MG tablet Commonly known as: AUGMENTIN   [facility] Cough DM 30 MG/5ML liquid Generic drug: dextromethorphan   [employer] Gentle Laxative 10 MG suppository Generic drug: bisacodyl   dicyclomine 10 MG capsule Commonly known as: BENTYL   haloperidol 0.5 MG tablet Commonly known as: HALDOL   HYDROcodone-acetaminophen 5-325 MG tablet Commonly known as: Norco   ibuprofen 200 MG tablet Commonly known as: ADVIL   LORazepam 0.5 MG tablet Commonly known as: ATIVAN   Senna Laxative 8.6 MG tablet Generic drug: senna     TAKE these medications   acetaminophen 500 MG tablet Commonly known as: TYLENOL Take 500 mg by mouth every 6 (six) hours as needed for mild pain or headache.   albuterol 108 (90 Base) MCG/ACT inhaler Commonly known as: VENTOLIN HFA INHALE 1 - 2 PUFFS BY MOUTH EVERY 4 HOURS AS NEEDED What changed: See the new instructions.   Albuterol Sulfate 2.5 MG/0.5ML Nebu Inhale 3 mLs into the lungs 4 (four) times daily as needed for shortness of breath. What changed: Another medication with the same name was changed. Make sure you understand how and when to take each.   citalopram 20 MG tablet Commonly known as: CELEXA TAKE 1 TABLET BY MOUTH  DAILY   clopidogrel 75 MG tablet Commonly known as: PLAVIX TAKE 1 TABLET BY MOUTH  DAILY   furosemide 20 MG tablet Commonly known as:  LASIX Take 10-20 mg by mouth daily. Taking 20 mg in the morning and 1/2 tablet (10mg ) in the evening.   ipratropium-albuterol 0.5-2.5 (3) MG/3ML Soln Commonly known as: DUONEB Inhale 3 mLs into the lungs every 4 (four) hours as needed for shortness of breath or wheezing.   levothyroxine 125 MCG tablet Commonly known as: SYNTHROID TAKE 1 TABLET BY MOUTH EVERY DAY What changed: when to take this   loratadine 10 MG tablet Commonly known as: CLARITIN TAKE 1 TABLET BY MOUTH EVERY DAY FOR 7 DAYS            Discharge Care Instructions  (From admission, onward)         Start     Ordered   03/18/20 0000  Discharge wound care:       Comments: Partial thickness loss of dermis presenting as a shallow open injury with a red, pink wound bed without slough. 14 days   03/18/20 1605          Disposition and follow-up:   Ms.Veronica [name] was discharged from [facility] in Stable condition.  At the hospital follow up visit please address:  1.  Patients lasix held on admission. Restarted morning dose and she can restart evening dose as well on DC. Evaluate volume status.   Review medications. Stopped multiple medications as patient did not need them on admission.   2.  Labs / imaging needed at time of follow-up: cmp  3.  Pending labs/ test needing follow-up: none  Follow-up Appointments:   Hospital Course by problem list: 1.  COVID-19: Presented with worsening shortness of breath after testing positive for covid-19 ten days prior to admission. She was requiring up to 2L supplemental oxygen on admission but subsequently was doing well on room air. Patient is on hospice care at home, goals of care were discussed with the patient's daughter and it was decided no aggressive measures would be taken during hospital stay. She was treated with steroids and IVF. She clinically stabilized; however, patient's daughter also tested positive for covid and was unable to resume  care at home. Plan to discharge to SNF on hospice until patient's daughter can resume care.    2.  Acute hypoxic respiratory failure: as detailed above.  3.  Hypernatremia: Likely 2/2 dehydration. Na 151 on admission, treated and improved with D5W.  4.  AKI: likely due to dehydration, improved with IV fluids.  5.  Vascular dementia: on admission patient was not alert or oriented x3. She returned to her baseline during hospital course, more alert and saying one word responses, although she is unable to communicate.  6.  Chronic congestive heart failure: lasix held initially during hospital stay. Restarted morning dose of lasix ( 20 mg) before discharge. Held evening dose , 10 mg.   Discharge Vitals:   BP (!) 121/56 (BP Location: Left Leg)    Pulse 82    Temp (!) 97.3 F (36.3 C) (Oral)    Resp 20    Ht 4\' 11"  (1.499 m)    Wt 82.3 kg    SpO2 93%    BMI 36.65 kg/m   Pertinent Labs, Studies, and Procedures:  CBC Latest Ref Rng & Units 03/08/2020 03/07/2020 03/06/2020  WBC 4.0 - 10.5 K/uL 14.6(H) 18.3(H) 19.2(H)  Hemoglobin 12.0 - 15.0 g/dL 03/08/2020 11.5(L) 11.3(L)  Hematocrit 36 - 46 % 37.9 35.5(L) 34.2(L)  Platelets 150 - 400 K/uL 253 201 153   CMP Latest Ref Rng & Units 03/18/2020 03/17/2020 03/16/2020  Glucose 70 - 99 mg/dL 89 05/16/2020) 397(Q)  BUN 8 - 23 mg/dL 734(L) 93(X) 90(W)  Creatinine 0.44 - 1.00 mg/dL 40(X 7.35 3.29  Sodium 135 - 145 mmol/L 137 138 136  Potassium 3.5 - 5.1 mmol/L 4.8 4.2 4.6  Chloride 98 - 111 mmol/L 100 101 98  CO2 22 - 32 mmol/L 29 25 24   Calcium 8.9 - 10.3 mg/dL 9.24) ) 2.6(S)  Total Protein 6.5 - 8.1 g/dL - - -  Total Bilirubin 0.3 - 1.2 mg/dL - - -  Alkaline Phos 38 - 126 U/L - - -  AST 15 - 41 U/L - - -  ALT 0 - 44 U/L - - -     Discharge Instructions: Discharge Instructions    Diet - low sodium heart healthy   Complete by: As directed    Discharge wound care:   Complete by: As directed    Partial thickness loss of dermis presenting as a shallow open  injury with a red, pink wound bed without slough. 14 days   Increase activity slowly   Complete by: As directed       Signed: 3.4(H, MD 03/18/2020, 4:05 PM

## 2020-03-04 NOTE — Progress Notes (Addendum)
   Subjective: Patient more interactive on exam today, smiling and giving one word responses.   Objective:  Vital signs in last 24 hours: Vitals:   03/03/20 0805 03/03/20 1540 03/03/20 1545 03/03/20 2249  BP: 132/85  (!) 129/100 (!) 115/48  Pulse: 66  72 74  Resp: 20  20 16   Temp: (!) 97.1 F (36.2 C)  97.7 F (36.5 C) 97.8 F (36.6 C)  TempSrc: Axillary   Oral  SpO2: 97% 96% 94% 95%  Weight:      Height:        Supplemental Oxygen: 95% on 2L [place]  Constitution: NAD, appears stated age Cardio: RRR, no m/r/g, b/l LE non-pitting edema  Respiratory: CTA, no w/r/r Abdominal: NTTP, soft, non-distended MSK: moving all extremities Neuro: not alert or oriented to person place or time, unable to communicate however more interactive today  Skin: c/d/i   Assessment/Plan:  Principal Problem:   COVID-19 Active Problems:   COVID-19 virus infection   Vascular dementia (HCC)   Chronic CHF (congestive heart failure) (HCC)   Acute respiratory failure with hypoxemia (HCC)   Hypothyroidism   Hypernatremia   AKI (acute kidney injury) (HCC)  84 year old female with history of CHF, stroke with significant cognitive deficits, hypothyroidism, and depression on hospice presenting with acute hypoxic respiratory failure after testing positive for Covid approximately 10 days ago. At baseline patient is unable to communicate, will make eye contact.She was recently transitioned to hospice care approximately 3 weeks ago due to decline in clinical condition.  Acute hypoxic respiratory failure COVID-19 Transaminitis Hospital day 3. Saturating 95% on 2L nasal cannula. Inflammatory markers pending. Plan to discharge to SNF with hospice care until daughter is well enough to resume care. Goals of care discussed and daughter agrees with no aggressive measures and to continue current regimen with supplemental oxygen, steroids and IVF as needed. Clinically stable for discharge, pending placement.   -  Dexamethasone day 3/5 - TOC consulted for SNF placement - robitussin q4h prn, tussionex q12h prn for cough  AKI Hypernatremia Morning labs pending. Continuing IVF.  - trend cmp - cont d5w  CHF with unknownEF Appears euvolemic, mild b/l LE non-pitting edema.   - continue to hold lasix    VTE prophylaxis:Lovenox IVF:D5W Diet:Soft Code: DNR  Dispo: Anticipated discharge pending SNF/hospice placement. Patient is clinically stable for discharge.  Nandi Tonnesen N, DO 03/04/2020, 7:08 AM Pager: [phone] After 5pm on weekdays and 1pm on weekends: On Call Pager: [phone]

## 2020-03-05 LAB — CBC
HCT: 35.7 % — ABNORMAL LOW (ref 36.0–46.0)
Hemoglobin: 11.6 g/dL — ABNORMAL LOW (ref 12.0–15.0)
MCH: 31 pg (ref 26.0–34.0)
MCHC: 32.5 g/dL (ref 30.0–36.0)
MCV: 95.5 fL (ref 80.0–100.0)
Platelets: 160 10*3/uL (ref 150–400)
RBC: 3.74 MIL/uL — ABNORMAL LOW (ref 3.87–5.11)
RDW: 13.8 % (ref 11.5–15.5)
WBC: 17.1 10*3/uL — ABNORMAL HIGH (ref 4.0–10.5)
nRBC: 0.1 % (ref 0.0–0.2)

## 2020-03-05 LAB — CULTURE, BLOOD (ROUTINE X 2): Special Requests: ADEQUATE

## 2020-03-05 LAB — GLUCOSE, CAPILLARY
Glucose-Capillary: 131 mg/dL — ABNORMAL HIGH (ref 70–99)
Glucose-Capillary: 126 mg/dL — ABNORMAL HIGH (ref 70–99)
Glucose-Capillary: 158 mg/dL — ABNORMAL HIGH (ref 70–99)
Glucose-Capillary: 175 mg/dL — ABNORMAL HIGH (ref 70–99)

## 2020-03-05 LAB — COMPREHENSIVE METABOLIC PANEL
ALT: 48 U/L — ABNORMAL HIGH (ref 0–44)
AST: 63 U/L — ABNORMAL HIGH (ref 15–41)
Albumin: 2.6 g/dL — ABNORMAL LOW (ref 3.5–5.0)
Alkaline Phosphatase: 54 U/L (ref 38–126)
Anion gap: 9 (ref 5–15)
BUN: 25 mg/dL — ABNORMAL HIGH (ref 8–23)
CO2: 26 mmol/L (ref 22–32)
Calcium: 8.2 mg/dL — ABNORMAL LOW (ref 8.9–10.3)
Chloride: 102 mmol/L (ref 98–111)
Creatinine, Ser: 0.92 mg/dL (ref 0.44–1.00)
GFR calc Af Amer: >60 mL/min (ref >60)
GFR calc non Af Amer: 58 mL/min — ABNORMAL LOW (ref >60)
Glucose, Bld: 176 mg/dL — ABNORMAL HIGH (ref 70–99)
Potassium: 4.3 mmol/L (ref 3.5–5.1)
Sodium: 137 mmol/L (ref 135–145)
Total Bilirubin: 0.6 mg/dL (ref 0.3–1.2)
Total Protein: 7.5 g/dL (ref 6.5–8.1)

## 2020-03-05 LAB — D-DIMER, QUANTITATIVE: D-Dimer, Quant: 6.29 ug/mL-FEU — ABNORMAL HIGH (ref 0.00–0.50)

## 2020-03-05 LAB — C-REACTIVE PROTEIN: CRP: 7.3 mg/dL — ABNORMAL HIGH (ref <1.0)

## 2020-03-05 LAB — FERRITIN: Ferritin: 1041 ng/mL — ABNORMAL HIGH (ref 11–307)

## 2020-03-05 NOTE — Progress Notes (Signed)
   Subjective: Patient continues t to be more interactive on the patient.  Attempted to speak and smiling.  Objective:  Vital signs in last 24 hours: Vitals:   03/03/20 2249 03/04/20 0743 03/04/20 1614 03/04/20 2230  BP: (!) 115/48 128/82 137/67 128/86  Pulse: 74 77 83 71  Resp: 16 16 16 16   Temp: 97.8 F (36.6 C) 97.6 F (36.4 C) 98.2 F (36.8 C) 97.6 F (36.4 C)  TempSrc: Oral   Oral  SpO2: 95% 95% 92% 97%  Weight:      Height:        Supplemental Oxygen: Seen saturating at 94% on room air  Constitution: NAD, appears stated age Cardio: RRR, no m/r/g, bilateral lower extremity nonpitting edema Respiratory: CTA, no w/r/r Abdominal: NTTP, soft, non-distended MSK: moving all extremities Neuro: normal affect, a&ox3 Skin: c/d/i   Assessment/Plan:  Principal Problem:   COVID-19 Active Problems:   COVID-19 virus infection   Vascular dementia (HCC)   Chronic CHF (congestive heart failure) (HCC)   Acute respiratory failure with hypoxemia (HCC)   Hypothyroidism   Hypernatremia   AKI (acute kidney injury) (HCC)   Pressure injury of skin  84 year old female with history of CHF, stroke with significant cognitive deficits, hypothyroidism, and depression on hospice presenting with acute hypoxic respiratory failure after testing positive for Covid approximately 10 days ago. At baseline patient is unable to communicate, will make eye contact.She was recently transitioned to hospice care approximately 3 weeks ago due to decline in clinical condition.  Acute hypoxic respiratory failure COVID-19 Transaminitis Saturating at 94% on room air.Inflammatory markers overall down trending per labs yesterday. Morning labs are pending. Plan to discharge to SNF with hospice care until daughter is well enough to resume care.   Goals of care discussed and daughter agrees with no aggressive measures and to continue current regimen with supplemental oxygen, steroids and IVF as  needed.Clinically stable for discharge, pending placement.   - continue dexamethasone - TOC consulted for SNF placement - supplemental oxygen as needed  AKI Hypernatremia Resolved per yesterdays labs, today's labs still pending. Cr 0.9. Na 140. Will continue IVF as needed.  -trend cmp  CHF with unknownEF Appears euvolemic, mild b/l LE non-pitting edema.   - continue to hold lasix   VTE prophylaxis:Lovenox 97  Diet:Soft Code: DNR  Dispo: Anticipated dischargepending SNF/hospice placement vs discharge home.Patient remains clinically stable for discharge.  Sefora Tietje N, DO 03/05/2020, 7:13 AM Pager: [phone] After 5pm on weekdays and 1pm on weekends: On Call Pager: [phone]

## 2020-03-05 NOTE — Progress Notes (Signed)
NT reporting to this RN that patient's urine is exceptionally foul smelling, dark, and very cloudy. Sevice paged and made aware. UA ordered.

## 2020-03-06 LAB — URINALYSIS, ROUTINE W REFLEX MICROSCOPIC
Bilirubin Urine: NEGATIVE
Glucose, UA: 50 mg/dL — AB
Ketones, ur: NEGATIVE mg/dL
Nitrite: NEGATIVE
Protein, ur: 30 mg/dL — AB
Specific Gravity, Urine: 1.019 (ref 1.005–1.030)
WBC, UA: >50 WBC/hpf — ABNORMAL HIGH (ref 0–5)
pH: 7 (ref 5.0–8.0)

## 2020-03-06 LAB — COMPREHENSIVE METABOLIC PANEL
ALT: 49 U/L — ABNORMAL HIGH (ref 0–44)
AST: 52 U/L — ABNORMAL HIGH (ref 15–41)
Albumin: 2.5 g/dL — ABNORMAL LOW (ref 3.5–5.0)
Alkaline Phosphatase: 55 U/L (ref 38–126)
Anion gap: 8 (ref 5–15)
BUN: 25 mg/dL — ABNORMAL HIGH (ref 8–23)
CO2: 26 mmol/L (ref 22–32)
Calcium: 8.2 mg/dL — ABNORMAL LOW (ref 8.9–10.3)
Chloride: 104 mmol/L (ref 98–111)
Creatinine, Ser: 0.85 mg/dL (ref 0.44–1.00)
GFR calc Af Amer: >60 mL/min (ref >60)
GFR calc non Af Amer: >60 mL/min (ref >60)
Glucose, Bld: 151 mg/dL — ABNORMAL HIGH (ref 70–99)
Potassium: 4.1 mmol/L (ref 3.5–5.1)
Sodium: 138 mmol/L (ref 135–145)
Total Bilirubin: 0.5 mg/dL (ref 0.3–1.2)
Total Protein: 7.3 g/dL (ref 6.5–8.1)

## 2020-03-06 LAB — CBC
HCT: 34.2 % — ABNORMAL LOW (ref 36.0–46.0)
Hemoglobin: 11.3 g/dL — ABNORMAL LOW (ref 12.0–15.0)
MCH: 31.3 pg (ref 26.0–34.0)
MCHC: 33 g/dL (ref 30.0–36.0)
MCV: 94.7 fL (ref 80.0–100.0)
Platelets: 153 10*3/uL (ref 150–400)
RBC: 3.61 MIL/uL — ABNORMAL LOW (ref 3.87–5.11)
RDW: 13.7 % (ref 11.5–15.5)
WBC: 19.2 10*3/uL — ABNORMAL HIGH (ref 4.0–10.5)
nRBC: 0 % (ref 0.0–0.2)

## 2020-03-06 LAB — GLUCOSE, CAPILLARY
Glucose-Capillary: 106 mg/dL — ABNORMAL HIGH (ref 70–99)
Glucose-Capillary: 200 mg/dL — ABNORMAL HIGH (ref 70–99)
Glucose-Capillary: 125 mg/dL — ABNORMAL HIGH (ref 70–99)
Glucose-Capillary: 118 mg/dL — ABNORMAL HIGH (ref 70–99)

## 2020-03-06 LAB — FERRITIN: Ferritin: 967 ng/mL — ABNORMAL HIGH (ref 11–307)

## 2020-03-06 LAB — C-REACTIVE PROTEIN: CRP: 9.4 mg/dL — ABNORMAL HIGH (ref <1.0)

## 2020-03-06 LAB — D-DIMER, QUANTITATIVE: D-Dimer, Quant: 4.81 ug/mL-FEU — ABNORMAL HIGH (ref 0.00–0.50)

## 2020-03-06 NOTE — Progress Notes (Signed)
   Subjective: Patient continues to be more interactive on evaluation. Seen eating with the help of nurses this morning, saturating well on RA.   Objective:  Vital signs in last 24 hours: Vitals:   03/04/20 2230 03/05/20 0739 03/05/20 1600 03/05/20 2131  BP: 128/86 (!) 151/68 (!) 111/91 114/69  Pulse: 71 69 76 68  Resp: 16 17 18 20   Temp: 97.6 F (36.4 C) 98.2 F (36.8 C) 99.3 F (37.4 C) 97.6 F (36.4 C)  TempSrc: Oral   Oral  SpO2: 97%  92% 100%  Weight:      Height:        Supplemental Oxygen: 100% on RA  Constitution: NAD, appears stated age Cardio: mild b/l LE non-pitting edema Respiratory: CTA, no w/r/r Abdominal: NTTP, soft, non-distended MSK: moving all extremities Neuro: more alert on exam, not oriented x3 Skin: c/d/i   Assessment/Plan:  Principal Problem:   COVID-19 Active Problems:   COVID-19 virus infection   Vascular dementia (HCC)   Chronic CHF (congestive heart failure) (HCC)   Acute respiratory failure with hypoxemia (HCC)   Hypothyroidism   Hypernatremia   AKI (acute kidney injury) (HCC)   Pressure injury of skin  84 year old female with history of CHF, stroke with significant cognitive deficits, hypothyroidism, and depression on hospice presenting with acute hypoxic respiratory failure after testing positive for Covid approximately 10 days ago. At baseline patient is unable to communicate, will make eye contact.She was recently transitioned to hospice care approximately 3 weeks ago due to decline in clinical condition.  Acute hypoxic respiratory failure COVID-19 Transaminitis Hospital day 4. Saturating at 100% on room air.Inflammatory markers down trending. Transaminitis overall improved. Plan to discharge to SNF with hospice care until daughter is well enough to resume care.   Goals of care discussed and daughter agrees with no aggressive measures and to continue current regimen with supplemental oxygen, steroids and IVF as  needed.Clinically stable for discharge, pending placement.  - continue dexamethasone - TOC consulted for SNF placement - supplemental oxygen as needed   CHF with unknownEF Appears euvolemic, mild b/l LE non-pitting edema.   - continue to hold lasix   VTE prophylaxis:Lovenox 97  Diet:Soft Code: DNR  Dispo: Anticipated dischargepending SNF/hospice placement vs discharge home.Patient remains clinically stable for discharge.  Zyona Pettaway N, DO 03/06/2020, 7:10 AM Pager: [phone] After 5pm on weekdays and 1pm on weekends: On Call Pager: [phone]

## 2020-03-07 ENCOUNTER — Encounter (HOSPITAL_COMMUNITY): Payer: Self-pay | Admitting: Internal Medicine

## 2020-03-07 LAB — CULTURE, BLOOD (ROUTINE X 2)
Culture: NO GROWTH
Special Requests: ADEQUATE

## 2020-03-07 LAB — COMPREHENSIVE METABOLIC PANEL
ALT: 69 U/L — ABNORMAL HIGH (ref 0–44)
AST: 33 U/L (ref 15–41)
Albumin: 2.4 g/dL — ABNORMAL LOW (ref 3.5–5.0)
Alkaline Phosphatase: 55 U/L (ref 38–126)
Anion gap: 10 (ref 5–15)
BUN: 27 mg/dL — ABNORMAL HIGH (ref 8–23)
CO2: 27 mmol/L (ref 22–32)
Calcium: 8.4 mg/dL — ABNORMAL LOW (ref 8.9–10.3)
Chloride: 101 mmol/L (ref 98–111)
Creatinine, Ser: 0.85 mg/dL (ref 0.44–1.00)
GFR calc Af Amer: >60 mL/min (ref >60)
GFR calc non Af Amer: >60 mL/min (ref >60)
Glucose, Bld: 125 mg/dL — ABNORMAL HIGH (ref 70–99)
Potassium: 3.7 mmol/L (ref 3.5–5.1)
Sodium: 138 mmol/L (ref 135–145)
Total Bilirubin: 0.3 mg/dL (ref 0.3–1.2)
Total Protein: 7.2 g/dL (ref 6.5–8.1)

## 2020-03-07 LAB — CBC
HCT: 35.5 % — ABNORMAL LOW (ref 36.0–46.0)
Hemoglobin: 11.5 g/dL — ABNORMAL LOW (ref 12.0–15.0)
MCH: 30.4 pg (ref 26.0–34.0)
MCHC: 32.4 g/dL (ref 30.0–36.0)
MCV: 93.9 fL (ref 80.0–100.0)
Platelets: 201 10*3/uL (ref 150–400)
RBC: 3.78 MIL/uL — ABNORMAL LOW (ref 3.87–5.11)
RDW: 13.6 % (ref 11.5–15.5)
WBC: 18.3 10*3/uL — ABNORMAL HIGH (ref 4.0–10.5)
nRBC: 0.1 % (ref 0.0–0.2)

## 2020-03-07 LAB — GLUCOSE, CAPILLARY
Glucose-Capillary: 83 mg/dL (ref 70–99)
Glucose-Capillary: 160 mg/dL — ABNORMAL HIGH (ref 70–99)
Glucose-Capillary: 172 mg/dL — ABNORMAL HIGH (ref 70–99)
Glucose-Capillary: 119 mg/dL — ABNORMAL HIGH (ref 70–99)

## 2020-03-07 LAB — C-REACTIVE PROTEIN: CRP: 7 mg/dL — ABNORMAL HIGH (ref <1.0)

## 2020-03-07 LAB — FERRITIN: Ferritin: 1146 ng/mL — ABNORMAL HIGH (ref 11–307)

## 2020-03-07 LAB — D-DIMER, QUANTITATIVE: D-Dimer, Quant: 4.26 ug/mL-FEU — ABNORMAL HIGH (ref 0.00–0.50)

## 2020-03-07 NOTE — Plan of Care (Signed)
  Problem: Clinical Measurements: Goal: Ability to maintain clinical measurements within normal limits will improve Outcome: Progressing Goal: Will remain free from infection Outcome: Progressing Goal: Diagnostic test results will improve Outcome: Progressing Goal: Respiratory complications will improve Outcome: Progressing Goal: Cardiovascular complication will be avoided Outcome: Progressing   Problem: Coping: Goal: Level of anxiety will decrease Outcome: Progressing   Problem: Elimination: Goal: Will not experience complications related to bowel motility Outcome: Progressing Goal: Will not experience complications related to urinary retention Outcome: Progressing   Problem: Pain Managment: Goal: General experience of comfort will improve Outcome: Progressing   Problem: Safety: Goal: Ability to remain free from injury will improve Outcome: Progressing   Problem: Skin Integrity: Goal: Risk for impaired skin integrity will decrease Outcome: Progressing   Problem: Coping: Goal: Psychosocial and spiritual needs will be supported Outcome: Progressing   Problem: Respiratory: Goal: Will maintain a patent airway Outcome: Progressing Goal: Complications related to the disease process, condition or treatment will be avoided or minimized Outcome: Progressing   Problem: Nutrition: Goal: Adequate nutrition will be maintained Outcome: Not Progressing   Problem: Education: Goal: Knowledge of General Education information will improve Description: Including pain rating scale, medication(s)/side effects and non-pharmacologic comfort measures Outcome: Not Met (add Reason)   Problem: Health Behavior/Discharge Planning: Goal: Ability to manage health-related needs will improve Outcome: Not Met (add Reason)   Problem: Activity: Goal: Risk for activity intolerance will decrease Outcome: Not Met (add Reason)   Problem: Education: Goal: Knowledge of risk factors and measures  for prevention of condition will improve Outcome: Not Met (add Reason)

## 2020-03-07 NOTE — TOC Progression Note (Addendum)
Transition of Care [facility]) - Progression Note    Patient Details  Name: Veronica [name] MRN: [id number] Date of Birth: 03-15-1936  Transition of Care [facility]) CM/SW Contact  Beckie Busing, RN Phone Number: [phone]  03/07/2020, 2:19 PM  Clinical Narrative:    CM attempted to call SW at [facility]. Message left for [facility] to call CM back. CM attempting to follow up to determine if this patient needs true respite placement snf. Will await return call.   1425 CM called daughter Girard Cooter to discuss placement options. Daughter states that she is currently in the hospital and that there is no one to take care of her mom. Daughter is agreeable to CM extending the bed search.Daughter requesting that her daughter be listed on patient chart to receive info. This info has been added. CM will extend the bed search. Info has been sent to covid accepting facilities [facility], [place], [facility], [place], and [facility]).   Expected Discharge Plan: Skilled Nursing Facility (For 5 day respite care) Barriers to Discharge: Continued Medical Work up  Expected Discharge Plan and Services Expected Discharge Plan: Skilled Nursing Facility (For 5 day respite care) In-house Referral: NA Discharge Planning Services: CM Consult Post Acute Care Choice: Skilled Nursing Facility Living arrangements for the past 2 months: Single Family Home                 DME Arranged: N/A DME Agency: NA       HH Arranged: NA HH Agency: NA         Social Determinants of Health (SDOH) Interventions    Readmission Risk Interventions No flowsheet data found.

## 2020-03-07 NOTE — Progress Notes (Signed)
   Subjective:   Patient evaluated at bedside this morning. She was resting comfortably and watching TV. Continues to be altered but denies discomfort.   Objective:  Vital signs in last 24 hours: Vitals:   03/05/20 2131 03/06/20 0837 03/06/20 1703 03/06/20 1945  BP: 114/69 (!) 130/99 (!) 150/63 134/70  Pulse: 68 73 71 65  Resp: 20 18  20   Temp: 97.6 F (36.4 C) 97.8 F (36.6 C) 97.9 F (36.6 C) 98.1 F (36.7 C)  TempSrc: Oral     SpO2: 100% 99% 99% 100%  Weight:      Height:       Physical Exam Vitals and nursing note reviewed.  Constitutional:      General: She is not in acute distress.    Appearance: She is obese.  Cardiovascular:     Rate and Rhythm: Normal rate and regular rhythm.     Heart sounds: No murmur heard.   Pulmonary:     Effort: Pulmonary effort is normal. No respiratory distress.  Musculoskeletal:     Right lower leg: Pitting Edema (trace) present.     Left lower leg: Pitting Edema (trace) present.  Skin:    General: Skin is warm and dry.  Neurological:     Mental Status: She is alert. She is disoriented.  Psychiatric:        Mood and Affect: Mood normal.        Behavior: Behavior normal.    Assessment/Plan:  Principal Problem:   COVID-19 Active Problems:   COVID-19 virus infection   Vascular dementia (HCC)   Chronic CHF (congestive heart failure) (HCC)   Acute respiratory failure with hypoxemia (HCC)   Hypothyroidism   Hypernatremia   AKI (acute kidney injury) (HCC)   Pressure injury of skin  84 year old female with history of CHF, stroke with significant cognitive deficits, hypothyroidism, and depression on hospice presenting with acute hypoxic respiratory failure after testing positive for Covid approximately 10 days ago. At baseline patient is unable to communicate, will make eye contact.She was recently transitioned to hospice care approximately 3 weeks ago due to decline in clinical condition.  # Acute hypoxic respiratory  failure # COVID-19 # Transaminitis Patient continues to saturate well on RA. Inflammatory markers continues to trend down. Stable for discharge, pending SNF placement.   -Continue dexamethasone day 5/10  -Supplemental oxygen as needed  # CHF Mostly euvolemic on examination.   - Continue to hold lasix  # Hypothyroidism - Continue levothyroxine   # History of Stroke  - Continue Plavix   VTE 97 ENI:[id number] Diet:Soft Code: DNR  Dispo: Anticipated dischargepending SNF placement.   Dr. NTI:RWER Internal Medicine PGY-2  Pager: [phone] After 5pm on weekdays and 1pm on weekends: On Call pager [phone]  03/07/2020, 7:08 AM

## 2020-03-08 DIAGNOSIS — F0150 Vascular dementia without behavioral disturbance: Secondary | ICD-10-CM

## 2020-03-08 DIAGNOSIS — U071 COVID-19: Principal | ICD-10-CM

## 2020-03-08 DIAGNOSIS — J9601 Acute respiratory failure with hypoxia: Secondary | ICD-10-CM

## 2020-03-08 LAB — COMPREHENSIVE METABOLIC PANEL
ALT: 56 U/L — ABNORMAL HIGH (ref 0–44)
AST: 39 U/L (ref 15–41)
Albumin: 2.3 g/dL — ABNORMAL LOW (ref 3.5–5.0)
Alkaline Phosphatase: 62 U/L (ref 38–126)
Anion gap: 9 (ref 5–15)
BUN: 24 mg/dL — ABNORMAL HIGH (ref 8–23)
CO2: 27 mmol/L (ref 22–32)
Calcium: 8.5 mg/dL — ABNORMAL LOW (ref 8.9–10.3)
Chloride: 100 mmol/L (ref 98–111)
Creatinine, Ser: 0.8 mg/dL (ref 0.44–1.00)
GFR calc Af Amer: >60 mL/min (ref >60)
GFR calc non Af Amer: >60 mL/min (ref >60)
Glucose, Bld: 104 mg/dL — ABNORMAL HIGH (ref 70–99)
Potassium: 3.6 mmol/L (ref 3.5–5.1)
Sodium: 136 mmol/L (ref 135–145)
Total Bilirubin: 0.4 mg/dL (ref 0.3–1.2)
Total Protein: 6.8 g/dL (ref 6.5–8.1)

## 2020-03-08 LAB — CBC WITH DIFFERENTIAL/PLATELET
Abs Immature Granulocytes: 0.33 10*3/uL — ABNORMAL HIGH (ref 0.00–0.07)
Basophils Absolute: 0.1 10*3/uL (ref 0.0–0.1)
Basophils Relative: 0 %
Eosinophils Absolute: 0 10*3/uL (ref 0.0–0.5)
Eosinophils Relative: 0 %
HCT: 37.9 % (ref 36.0–46.0)
Hemoglobin: 12.6 g/dL (ref 12.0–15.0)
Immature Granulocytes: 2 %
Lymphocytes Relative: 18 %
Lymphs Abs: 2.6 10*3/uL (ref 0.7–4.0)
MCH: 31.4 pg (ref 26.0–34.0)
MCHC: 33.2 g/dL (ref 30.0–36.0)
MCV: 94.5 fL (ref 80.0–100.0)
Monocytes Absolute: 0.7 10*3/uL (ref 0.1–1.0)
Monocytes Relative: 5 %
Neutro Abs: 10.9 10*3/uL — ABNORMAL HIGH (ref 1.7–7.7)
Neutrophils Relative %: 75 %
Platelets: 253 10*3/uL (ref 150–400)
RBC: 4.01 MIL/uL (ref 3.87–5.11)
RDW: 14 % (ref 11.5–15.5)
WBC: 14.6 10*3/uL — ABNORMAL HIGH (ref 4.0–10.5)
nRBC: 0 % (ref 0.0–0.2)

## 2020-03-08 LAB — FERRITIN: Ferritin: 1125 ng/mL — ABNORMAL HIGH (ref 11–307)

## 2020-03-08 LAB — GLUCOSE, CAPILLARY
Glucose-Capillary: 98 mg/dL (ref 70–99)
Glucose-Capillary: 125 mg/dL — ABNORMAL HIGH (ref 70–99)
Glucose-Capillary: 155 mg/dL — ABNORMAL HIGH (ref 70–99)
Glucose-Capillary: 145 mg/dL — ABNORMAL HIGH (ref 70–99)

## 2020-03-08 LAB — D-DIMER, QUANTITATIVE: D-Dimer, Quant: 3.35 ug/mL-FEU — ABNORMAL HIGH (ref 0.00–0.50)

## 2020-03-08 LAB — C-REACTIVE PROTEIN: CRP: 4.5 mg/dL — ABNORMAL HIGH (ref <1.0)

## 2020-03-08 NOTE — Progress Notes (Signed)
Nutrition Follow-up  DOCUMENTATION CODES:   Not applicable  INTERVENTION:  Continue Ensure Enlive po TID, each supplement provides 350 kcal and 20 grams of protein  Continue MVI daily   NUTRITION DIAGNOSIS:   Increased nutrient needs related to acute illness as evidenced by estimated needs.  Ongoing  GOAL:   Patient will meet greater than or equal to 90% of their needs  Progressing  MONITOR:   PO intake, Supplement acceptance, Weight trends, Labs, I & O's  REASON FOR ASSESSMENT:   Consult Assessment of nutrition requirement/status  ASSESSMENT:   Patient with PMH significant for stroke, CHF, depression, and vascular dementia. Presents this admission with COVID 19 infection.  Pt's inflammatory markers continue to improve and O2 sats are normal on RA. Pt is stable for d/c to SNF pending bed availability. Per MD, pt ate entire breakfast this morning.   PO Intake: 95% x 1 recorded meal  Labs reviewed. Medications: Ensure Enlive TID, Decadron, MVI   Diet Order:   Diet Order            DIET - DYS 1 Room service appropriate? No; Fluid consistency: Thin  Diet effective now                 EDUCATION NEEDS:   Not appropriate for education at this time  Skin:  Skin Assessment: Skin Integrity Issues: Skin Integrity Issues:: Stage II Stage II: R buttocks  Last BM:  9/25  Height:   Ht Readings from Last 1 Encounters:  03/02/20 4\' 11"  (1.499 m)    Weight:   Wt Readings from Last 1 Encounters:  03/02/20 90.7 kg    BMI:  Body mass index is 40.4 kg/m.  Estimated Nutritional Needs:   Kcal:  1600-1800 kcal  Protein:  80-95 grams  Fluid:  >/= 1.6 L/day    03/04/20, MS, RD, LDN RD pager number and weekend/on-call pager number located in Amion.

## 2020-03-08 NOTE — TOC Progression Note (Signed)
Transition of Care [facility]) - Progression Note    Patient Details  Name: Veronica [name] MRN: [id number] Date of Birth: 11-23-1935  Transition of Care [facility]) CM/SW Contact  Beckie Busing, RN Phone Number: [phone]  03/08/2020, 9:21 AM  Clinical Narrative:    Currently no bed offers CM has reached out to pending facilities for bed request.   Expected Discharge Plan: Skilled Nursing Facility (For 5 day respite care) Barriers to Discharge: Continued Medical Work up  Expected Discharge Plan and Services Expected Discharge Plan: Skilled Nursing Facility (For 5 day respite care) In-house Referral: NA Discharge Planning Services: CM Consult Post Acute Care Choice: Skilled Nursing Facility Living arrangements for the past 2 months: Single Family Home                 DME Arranged: N/A DME Agency: NA       HH Arranged: NA HH Agency: NA         Social Determinants of Health (SDOH) Interventions    Readmission Risk Interventions No flowsheet data found.

## 2020-03-08 NOTE — Plan of Care (Signed)

## 2020-03-08 NOTE — Progress Notes (Signed)
   Subjective:   Ms. Urschel is resting comfortably. She denies any discomfort at this time. She was able to eat her entire breakfast.   Objective:  Vital signs in last 24 hours: Vitals:   03/06/20 1945 03/07/20 0833 03/07/20 1711 03/07/20 2010  BP: 134/70 137/60 (!) 127/57 (!) 143/70  Pulse: 65 68 71 68  Resp: 20 20 20 20   Temp: 98.1 F (36.7 C) 97.7 F (36.5 C) 97.9 F (36.6 C) 98 F (36.7 C)  TempSrc:      SpO2: 100% 99% 98% 97%  Weight:      Height:       Physical Exam Vitals and nursing note reviewed.  Constitutional:      General: She is not in acute distress.    Appearance: She is obese.  Cardiovascular:     Rate and Rhythm: Normal rate and regular rhythm.     Heart sounds: No murmur heard.   Pulmonary:     Effort: Pulmonary effort is normal. No respiratory distress.  Skin:    General: Skin is warm and dry.     Findings: No rash.  Neurological:     Mental Status: She is alert. Mental status is at baseline. She is disoriented.  Psychiatric:        Mood and Affect: Mood normal.        Behavior: Behavior normal.    Assessment/Plan:  Principal Problem:   COVID-19 Active Problems:   COVID-19 virus infection   Vascular dementia (HCC)   Chronic CHF (congestive heart failure) (HCC)   Acute respiratory failure with hypoxemia (HCC)   Hypothyroidism   Hypernatremia   AKI (acute kidney injury) (HCC)   Pressure injury of skin  84 year old female with history of CHF, stroke with significant cognitive deficits, hypothyroidism, and depression on hospice presenting with acute hypoxic respiratory failure after testing positive for Covid approximately 10 days ago. At baseline patient is unable to communicate, will make eye contact.She was recently transitioned to hospice care approximately 3 weeks ago due to decline in clinical condition.  # Acute hypoxic respiratory failure # COVID-19 # Transaminitis Patient continues to saturate well on RA. Inflammatory markers  continues to trend down. Stable for discharge, pending SNF placement.   -Continue dexamethasone day 7/10  -Supplemental oxygen as needed  # CHF - Continue to hold lasix  VTE 97 TDV:[id number] Diet:Soft Code: DNR  Dispo: Anticipated dischargepending SNF placement.   Dr. XTG:GYIR Internal Medicine PGY-2  Pager: [phone] After 5pm on weekdays and 1pm on weekends: On Call pager [phone]  03/08/2020, 7:14 AM

## 2020-03-09 LAB — GLUCOSE, CAPILLARY
Glucose-Capillary: 130 mg/dL — ABNORMAL HIGH (ref 70–99)
Glucose-Capillary: 223 mg/dL — ABNORMAL HIGH (ref 70–99)
Glucose-Capillary: 180 mg/dL — ABNORMAL HIGH (ref 70–99)
Glucose-Capillary: 116 mg/dL — ABNORMAL HIGH (ref 70–99)

## 2020-03-09 NOTE — Progress Notes (Signed)
   Subjective:   Mrs. Abramo is resting comfortably this morning. She states she is feeling well and denies any complaints at this time.   Objective:  Vital signs in last 24 hours: Vitals:   03/08/20 0736 03/08/20 1632 03/08/20 2320 03/09/20 0703  BP: 137/65 (!) 149/76 136/72 (!) 120/59  Pulse: 63 70 70 72  Resp: 16 16 18 19   Temp: 98.3 F (36.8 C) 97.9 F (36.6 C) (!) 97.5 F (36.4 C) 97.6 F (36.4 C)  TempSrc:   Oral Oral  SpO2:   93% 98%  Weight:      Height:       Physical Exam Vitals and nursing note reviewed.  Constitutional:      General: She is not in acute distress.    Appearance: She is obese.  Pulmonary:     Effort: Pulmonary effort is normal. No respiratory distress.  Skin:    General: Skin is warm and dry.     Findings: No rash.  Neurological:     Mental Status: She is alert. Mental status is at baseline. She is disoriented.  Psychiatric:        Mood and Affect: Mood normal.        Behavior: Behavior normal.    Assessment/Plan:  Principal Problem:   COVID-19 Active Problems:   COVID-19 virus infection   Vascular dementia (HCC)   Chronic CHF (congestive heart failure) (HCC)   Acute hypoxemic respiratory failure due to COVID-19 [facility])   Hypothyroidism   Hypernatremia   AKI (acute kidney injury) (HCC)   Pressure injury of skin  84 year old female with history of CHF, stroke with significant cognitive deficits, hypothyroidism, and depression on hospice presenting with acute hypoxic respiratory failure after testing positive for Covid approximately 10 days ago. At baseline patient is unable to communicate, will make eye contact.She was recently transitioned to hospice care approximately 3 weeks ago due to decline in clinical condition.  # Acute hypoxic respiratory failure # COVID-19 # Transaminitis No changes in condition; continues to saturate well on room air. Stable for discharge, pending SNF placement.   -Continue dexamethasone day 8/10    # CHF Euvolemic.   - Continue to hold lasix  VTE 97 WUJ:[id number] Diet:Soft Code: DNR  Dispo: Anticipated dischargepending SNF placement.   Dr. WGN:FAOZ Internal Medicine PGY-2  Pager: [phone] After 5pm on weekdays and 1pm on weekends: On Call pager [phone]  03/09/2020, 7:04 AM

## 2020-03-10 LAB — GLUCOSE, CAPILLARY
Glucose-Capillary: 101 mg/dL — ABNORMAL HIGH (ref 70–99)
Glucose-Capillary: 144 mg/dL — ABNORMAL HIGH (ref 70–99)
Glucose-Capillary: 202 mg/dL — ABNORMAL HIGH (ref 70–99)
Glucose-Capillary: 180 mg/dL — ABNORMAL HIGH (ref 70–99)

## 2020-03-10 MED ORDER — POLYETHYLENE GLYCOL 3350 17 G PO PACK
17.0000 g | PACK | Freq: Every day | ORAL | Status: DC
  Administered 2020-03-10 – 2020-03-18 (×7): 17 g via ORAL
  Filled 2020-03-10 (×7): qty 1

## 2020-03-10 MED ORDER — SENNOSIDES-DOCUSATE SODIUM 8.6-50 MG PO TABS
1.0000 | ORAL_TABLET | Freq: Every day | ORAL | Status: DC
  Administered 2020-03-10 – 2020-03-18 (×9): 1 via ORAL
  Filled 2020-03-10 (×9): qty 1

## 2020-03-10 MED ORDER — DEXAMETHASONE 4 MG PO TABS
6.0000 mg | ORAL_TABLET | Freq: Every day | ORAL | Status: AC
  Administered 2020-03-11: 6 mg via ORAL
  Filled 2020-03-10: qty 2

## 2020-03-10 NOTE — Plan of Care (Signed)
  Problem: Health Behavior/Discharge Planning: Goal: Ability to manage health-related needs will improve Outcome: Progressing   Problem: Clinical Measurements: Goal: Ability to maintain clinical measurements within normal limits will improve Outcome: Progressing Goal: Will remain free from infection Outcome: Progressing Goal: Diagnostic test results will improve Outcome: Progressing Goal: Respiratory complications will improve Outcome: Progressing Goal: Cardiovascular complication will be avoided Outcome: Progressing   Problem: Activity: Goal: Risk for activity intolerance will decrease Outcome: Progressing   Problem: Nutrition: Goal: Adequate nutrition will be maintained Outcome: Progressing   Problem: Coping: Goal: Level of anxiety will decrease Outcome: Progressing   Problem: Elimination: Goal: Will not experience complications related to bowel motility Outcome: Progressing Goal: Will not experience complications related to urinary retention Outcome: Progressing   Problem: Pain Managment: Goal: General experience of comfort will improve Outcome: Progressing   Problem: Safety: Goal: Ability to remain free from injury will improve Outcome: Progressing   Problem: Skin Integrity: Goal: Risk for impaired skin integrity will decrease Outcome: Progressing   Problem: Education: Goal: Knowledge of risk factors and measures for prevention of condition will improve Outcome: Progressing   Problem: Coping: Goal: Psychosocial and spiritual needs will be supported Outcome: Progressing   Problem: Respiratory: Goal: Will maintain a patent airway Outcome: Progressing Goal: Complications related to the disease process, condition or treatment will be avoided or minimized Outcome: Progressing   

## 2020-03-10 NOTE — Plan of Care (Signed)
  Problem: Clinical Measurements: Goal: Ability to maintain clinical measurements within normal limits will improve Outcome: Progressing Goal: Will remain free from infection Outcome: Progressing Goal: Diagnostic test results will improve Outcome: Progressing Goal: Respiratory complications will improve Outcome: Progressing Goal: Cardiovascular complication will be avoided Outcome: Progressing   Problem: Nutrition: Goal: Adequate nutrition will be maintained Outcome: Progressing   Problem: Coping: Goal: Level of anxiety will decrease Outcome: Progressing   Problem: Elimination: Goal: Will not experience complications related to bowel motility Outcome: Progressing Goal: Will not experience complications related to urinary retention Outcome: Progressing   Problem: Pain Managment: Goal: General experience of comfort will improve Outcome: Progressing   Problem: Safety: Goal: Ability to remain free from injury will improve Outcome: Progressing   Problem: Skin Integrity: Goal: Risk for impaired skin integrity will decrease Outcome: Progressing   Problem: Coping: Goal: Psychosocial and spiritual needs will be supported Outcome: Progressing   Problem: Respiratory: Goal: Will maintain a patent airway Outcome: Progressing Goal: Complications related to the disease process, condition or treatment will be avoided or minimized Outcome: Progressing   Problem: Health Behavior/Discharge Planning: Goal: Ability to manage health-related needs will improve Outcome: Not Progressing   Problem: Education: Goal: Knowledge of General Education information will improve Description: Including pain rating scale, medication(s)/side effects and non-pharmacologic comfort measures Outcome: Not Met (add Reason)   Problem: Activity: Goal: Risk for activity intolerance will decrease Outcome: Not Met (add Reason)   Problem: Education: Goal: Knowledge of risk factors and measures for  prevention of condition will improve Outcome: Not Met (add Reason)

## 2020-03-10 NOTE — Progress Notes (Signed)
   Subjective:   Veronica [name] is resting comfortably this morning. She is sipping on her Ensure. She denies any complaints at this time.  Objective:  Vital signs in last 24 hours: Vitals:   03/09/20 0703 03/09/20 1716 03/09/20 2352 03/10/20 0743  BP: (!) 120/59 (!) 117/54 139/68 121/65  Pulse: 72 76 77 72  Resp: 19 20 18 16   Temp: 97.6 F (36.4 C) 98.7 F (37.1 C) 97.8 F (36.6 C) 98.6 F (37 C)  TempSrc: Oral  Axillary   SpO2: 98% 97% 95% 97%  Weight:      Height:       Physical Exam Vitals and nursing note reviewed.  Constitutional:      General: She is not in acute distress.    Appearance: She is obese.  Pulmonary:     Effort: Pulmonary effort is normal. No respiratory distress.  Skin:    General: Skin is warm and dry.     Findings: No rash.  Neurological:     Mental Status: She is alert. Mental status is at baseline. She is disoriented.  Psychiatric:        Mood and Affect: Mood normal.        Behavior: Behavior normal.    Assessment/Plan:  Principal Problem:   COVID-19 Active Problems:   COVID-19 virus infection   Vascular dementia (HCC)   Chronic CHF (congestive heart failure) (HCC)   Acute hypoxemic respiratory failure due to COVID-19 [facility])   Hypothyroidism   Hypernatremia   AKI (acute kidney injury) (HCC)   Pressure injury of skin  84 year old female with history of CHF, stroke with significant cognitive deficits, hypothyroidism, and depression on hospice presenting with acute hypoxic respiratory failure after testing positive for Covid approximately 10 days ago. At baseline patient is unable to communicate, will make eye contact.She was recently transitioned to hospice care approximately 3 weeks ago due to decline in clinical condition.  # Acute hypoxic respiratory failure # COVID-19 # Transaminitis Stable for discharge, pending SNF placement. Will switch to PO Decadron tomorrow so we can remove IVs today in preparation of discharge.    -Continue dexamethasone day 9/10   # CHF - Continue to hold lasix - Will need to decide whether to continue holding on discharge given patient has not required it inpatient  # Constipation Required manual disimpaction yesterday by RN  - Added Miralax and scheduled Senna-S daily  VTE 11/10 MCE:[id number] Diet:Soft Code: DNR  Dispo: Anticipated dischargepending SNF placement.   Dr. QAE:SLPN Internal Medicine PGY-2  Pager: [phone] After 5pm on weekdays and 1pm on weekends: On Call pager [phone]  03/10/2020, 7:45 AM

## 2020-03-11 LAB — GLUCOSE, CAPILLARY
Glucose-Capillary: 95 mg/dL (ref 70–99)
Glucose-Capillary: 102 mg/dL — ABNORMAL HIGH (ref 70–99)
Glucose-Capillary: 181 mg/dL — ABNORMAL HIGH (ref 70–99)
Glucose-Capillary: 151 mg/dL — ABNORMAL HIGH (ref 70–99)

## 2020-03-11 MED ORDER — FUROSEMIDE 20 MG PO TABS: 20.0000 mg | ORAL_TABLET | Freq: Every day | ORAL | Status: DC

## 2020-03-11 NOTE — Progress Notes (Signed)
   Subjective:  Veronica [name] is a 84 y.o. with PMH of CHF, stroke with significant cognitive deficits, hypothyroidism, and depression on hosptice admitted for acute hypoxic respiratory failure secondary to Covid-19 Pneumonia on hospital day 9. She tested positive for Covid-19 approximately 10 days before admission.   Objective:  Vital signs in last 24 hours: Vitals:   03/09/20 2352 03/10/20 0743 03/10/20 1628 03/10/20 2034  BP: 139/68 121/65 121/65 (!) 124/54  Pulse: 77 72 84 (!) 102  Resp: 18 16 16 20   Temp: 97.8 F (36.6 C) 98.6 F (37 C) 98.5 F (36.9 C) 97.9 F (36.6 C)  TempSrc: Axillary Oral Oral   SpO2: 95% 97% 97% 97%  Weight:      Height:        General: NAD, Elderly Female Cardiovascular: Normal rate, regular rhythm.  No murmurs, rubs, or gallops Pulmonary : Effort normal, no wheezing or rhonchi Abdominal: soft, nontender,  bowel sounds present  CBC Latest Ref Rng & Units 03/08/2020 03/07/2020 03/06/2020  WBC 4.0 - 10.5 K/uL 14.6(H) 18.3(H) 19.2(H)  Hemoglobin 12.0 - 15.0 g/dL 03/08/2020 11.5(L) 11.3(L)  Hematocrit 36 - 46 % 37.9 35.5(L) 34.2(L)  Platelets 150 - 400 K/uL 253 201 153    BMP Latest Ref Rng & Units 03/08/2020 03/07/2020 03/06/2020  Glucose 70 - 99 mg/dL 03/08/2020) 109(N) 235(T)  BUN 8 - 23 mg/dL 732(K) 02(R) 42(H)  Creatinine 0.44 - 1.00 mg/dL 06(C 3.76 2.83  Sodium 135 - 145 mmol/L 136 138 138  Potassium 3.5 - 5.1 mmol/L 3.6 3.7 4.1  Chloride 98 - 111 mmol/L 100 101 104  CO2 22 - 32 mmol/L 27 27 26   Calcium 8.9 - 10.3 mg/dL 1.51) ) 7.6(H)     Assessment/Plan:  Principal Problem:   COVID-19 Active Problems:   COVID-19 virus infection   Vascular dementia (HCC)   Chronic CHF (congestive heart failure) (HCC)   Acute hypoxemic respiratory failure due to COVID-19 [facility])   Hypothyroidism   Hypernatremia   AKI (acute kidney injury) (HCC)   Pressure injury of skin  84 year old female with history of CHF, stroke with significant cognitive  deficits, hypothyroidism, and depression on hospice presented with acute hypoxic respiratory failure who tested positive for Covid approximately 10 days before admission on 9/22.  # COVID-19 pneumonia Today was day 10 of 10 of dexamethasone.  Patient is on room air and discharge pending SNF placement.  Talk to case manager and no bed placement to date.  # Constipation Continue Miralax scheduled senna S daily  #CHF Patient on 20 mg in the morning and 10 mg Lasix in the evening at home.  Lasix has been held.  She has lower extremity edema on exam  -20 mg Lasix daily   DVT prophx: Lovenox Diet: Dysphagia 1 Bowel: MiraLAX and senna docusate Code: DNR  Dispo: Anticipated discharge pending bed offer at SNF.   97, MD Internal Medicine PGY-2  Pager: [phone] After 5pm on weekdays and 1pm on weekends: On Call pager [phone]

## 2020-03-12 LAB — GLUCOSE, CAPILLARY
Glucose-Capillary: 118 mg/dL — ABNORMAL HIGH (ref 70–99)
Glucose-Capillary: 97 mg/dL (ref 70–99)
Glucose-Capillary: 108 mg/dL — ABNORMAL HIGH (ref 70–99)

## 2020-03-12 NOTE — Progress Notes (Signed)
   Subjective:   Veronica [name] is a 84 y.o. with PMH of CHF, stroke with significant cognitive deficits, hypothyroidism, and depression on hosptice admitted for acute hypoxic respiratory failure secondary to Covid-19 Pneumonia on hospital day 10. She tested positive for Covid-19 approximately 10 days before admission.  Patient was sitting up in bed drinking Ensure.  Does not answer questions due to underlying dementia.   Objective:  Vital signs in last 24 hours: Vitals:   03/10/20 2034 03/11/20 0753 03/11/20 1635 03/11/20 2123  BP: (!) 124/54 130/72 (!) 109/57 129/66  Pulse: (!) 102 76 82 79  Resp: 20 (!) 30 20 20   Temp: 97.9 F (36.6 C) 98.3 F (36.8 C) 98.2 F (36.8 C) 97.6 F (36.4 C)  TempSrc:    Oral  SpO2: 97% 98% 96% 93%  Weight:      Height:       Gen: Sitting up in bed, no distress, appears comfortable Resp: Unlabored, not tachypneic, saturating well on room air Ext: Chronic lower extremity edema, stasis changes of the skin   Assessment/Plan:  Principal Problem:   COVID-19 Active Problems:   COVID-19 virus infection   Vascular dementia (HCC)   Chronic CHF (congestive heart failure) (HCC)   Acute hypoxemic respiratory failure due to COVID-19 (HCC)   Hypothyroidism   Hypernatremia   AKI (acute kidney injury) (HCC)   Pressure injury of skin  84 year old female with history of CHF, stroke with significant cognitive deficits, hypothyroidism, and depression on hospice presented with acute hypoxic respiratory failure who tested positive for Covid approximately 10 days before admission on 9/22.  # COVID-19 pneumonia Stable, recovering well.  Plan to discontinue dexamethasone today, has completed a 10-day course.  Patient is on room air and discharge pending SNF placement, bed availability has been limited.  We will talk to her daughter, other potential disposition is back to home if the daughter is feeling well enough to take care of her  again.  #Constipation Continue Miralax scheduled senna S daily  #Chronic CHF Patient on 20 mg in the morning and 10 mg Lasix in the evening at home.  Lasix has been held.  Appears euvolemic today.  DVT prophx: Lovenox adjusted for obesity Diet: Dysphagia 1 Bowel: MiraLAX and senna docusate Code: DNR  Dispo: Anticipated discharge pending bed offer at SNF.  For questions or orders, please call: 10/22, MD Internal Medicine PGY-2  Pager: [phone] After 5pm on weekdays and 1pm on weekends: On Call pager [phone]

## 2020-03-13 LAB — BASIC METABOLIC PANEL
Anion gap: 10 (ref 5–15)
BUN: 38 mg/dL — ABNORMAL HIGH (ref 8–23)
CO2: 28 mmol/L (ref 22–32)
Calcium: 9.1 mg/dL (ref 8.9–10.3)
Chloride: 95 mmol/L — ABNORMAL LOW (ref 98–111)
Creatinine, Ser: 0.99 mg/dL (ref 0.44–1.00)
GFR calc Af Amer: >60 mL/min (ref >60)
GFR calc non Af Amer: 53 mL/min — ABNORMAL LOW (ref >60)
Glucose, Bld: 130 mg/dL — ABNORMAL HIGH (ref 70–99)
Potassium: 4.4 mmol/L (ref 3.5–5.1)
Sodium: 133 mmol/L — ABNORMAL LOW (ref 135–145)

## 2020-03-13 LAB — GLUCOSE, CAPILLARY
Glucose-Capillary: 80 mg/dL (ref 70–99)
Glucose-Capillary: 90 mg/dL (ref 70–99)
Glucose-Capillary: 136 mg/dL — ABNORMAL HIGH (ref 70–99)
Glucose-Capillary: 125 mg/dL — ABNORMAL HIGH (ref 70–99)

## 2020-03-13 NOTE — Progress Notes (Signed)
° °  Subjective:   Veronica [name] is a 84 y.o. with PMH of CHF, stroke with significant cognitive deficits, hypothyroidism, and depression on hosptice admitted for acute hypoxic respiratory failure secondary to Covid-19 Pneumonia on hospital day 11. She tested positive for Covid-19 approximately 10 days before admission.   She is smiling on exam, but nonverbal.   Objective:  Vital signs in last 24 hours: Vitals:   03/11/20 1635 03/11/20 2123 03/12/20 0735 03/12/20 1621  BP: (!) 109/57 129/66 (!) 153/75 (!) 109/53  Pulse: 82 79 70 78  Resp: 20 20 16 16   Temp: 98.2 F (36.8 C) 97.6 F (36.4 C) 98.1 F (36.7 C)   TempSrc:  Oral Oral   SpO2: 96% 93% 96% 96%  Weight:      Height:        Gen: Sitting in bed in no acute distress, pleasant.appearing Resp: non tachyphemic, non labored, sats well on room air Ext: Chronic lower extremity edema, non pitting on exam.   Assessment/Plan:  Principal Problem:   COVID-19 Active Problems:   COVID-19 virus infection   Vascular dementia (HCC)   Chronic CHF (congestive heart failure) (HCC)   Acute hypoxemic respiratory failure due to COVID-19 [facility])   Hypothyroidism   Hypernatremia   AKI (acute kidney injury) (HCC)   Pressure injury of skin  84 year old female with history of CHF, stroke with significant cognitive deficits, hypothyroidism, and depression on hospice presented with acute hypoxic respiratory failure who tested positive for Covid approximately 10 days before admission on 9/22.  # COVID-19 pneumonia Patient has completed 21-day quarantine from outpatient positive Covid test.  She has finished treatment with dexamethasone and is not requiring any supplemental oxygen.  Will discuss with transitions of care the status of placement and availability given she no longer needs quarantine.  Daughter would like patient placed in facility in [place].  #Constipation Continue Miralax scheduled senna S daily  #Chronic CHF Patient on  20 mg in the morning and 10 mg Lasix in the evening at home.  Lasix has been held.  .  DVT prophx: Lovenox adjusted for obesity Diet: Dysphagia 1 Bowel: MiraLAX and senna docusate Code: DNR  Dispo: Anticipated discharge pending bed offer at SNF.  For questions or orders, please call: [place], MD Internal Medicine PGY-2  Pager: [phone] After 5pm on weekdays and 1pm on weekends: On Call pager [phone]

## 2020-03-14 LAB — GLUCOSE, CAPILLARY
Glucose-Capillary: 107 mg/dL — ABNORMAL HIGH (ref 70–99)
Glucose-Capillary: 140 mg/dL — ABNORMAL HIGH (ref 70–99)
Glucose-Capillary: 127 mg/dL — ABNORMAL HIGH (ref 70–99)

## 2020-03-14 LAB — BASIC METABOLIC PANEL
Anion gap: 9 (ref 5–15)
BUN: 35 mg/dL — ABNORMAL HIGH (ref 8–23)
CO2: 28 mmol/L (ref 22–32)
Calcium: 8.8 mg/dL — ABNORMAL LOW (ref 8.9–10.3)
Chloride: 98 mmol/L (ref 98–111)
Creatinine, Ser: 0.95 mg/dL (ref 0.44–1.00)
GFR calc Af Amer: >60 mL/min (ref >60)
GFR calc non Af Amer: 56 mL/min — ABNORMAL LOW (ref >60)
Glucose, Bld: 98 mg/dL (ref 70–99)
Potassium: 4.4 mmol/L (ref 3.5–5.1)
Sodium: 135 mmol/L (ref 135–145)

## 2020-03-14 LAB — PHOSPHORUS: Phosphorus: 3.5 mg/dL (ref 2.5–4.6)

## 2020-03-14 LAB — MAGNESIUM: Magnesium: 2.2 mg/dL (ref 1.7–2.4)

## 2020-03-14 MED ORDER — FUROSEMIDE 20 MG PO TABS
20.0000 mg | ORAL_TABLET | Freq: Once | ORAL | Status: AC
  Administered 2020-03-14: 20 mg via ORAL
  Filled 2020-03-14: qty 1

## 2020-03-14 NOTE — Progress Notes (Signed)
° °  Subjective:   Veronica [name] is a 84 y.o. with PMH of CHF, stroke with significant cognitive deficits, hypothyroidism, and depression on hosptice admitted for acute hypoxic respiratory failure secondary to Covid-19 Pneumonia on hospital day 12. She tested positive for Covid-19 approximately 10 days before admission.  Patient pleasant on exam and says hey. Denies any pain.   Objective:  Vital signs in last 24 hours: Vitals:   03/12/20 1621 03/13/20 0727 03/13/20 1617 03/13/20 2018  BP: (!) 109/53 127/60 (!) 127/56 (!) 122/43  Pulse: 78 75 84 99  Resp: 16 16 16 20   Temp:  97.9 F (36.6 C) 97.9 F (36.6 C) 98.9 F (37.2 C)  TempSrc:      SpO2: 96%  95% 97%  Weight:      Height:        General: NAD, Elderly Female  Cardiovascular, RRR, no mrg Pulmonary: no labored breathing, normal effort, no wheezing Ext: Trace edema in lower extremities, chronic venous statis changes in LE  Assessment/Plan:  Principal Problem:   COVID-19 Active Problems:   COVID-19 virus infection   Vascular dementia (HCC)   Chronic CHF (congestive heart failure) (HCC)   Acute hypoxemic respiratory failure due to COVID-19 [facility])   Hypothyroidism   Hypernatremia   AKI (acute kidney injury) (HCC)   Pressure injury of skin  84 year old female with history of CHF, stroke with significant cognitive deficits, hypothyroidism, and depression on hospice presented with acute hypoxic respiratory failure who tested positive for Covid approximately 10 days before admission on 9/22.  # COVID-19 pneumonia Patient is asymptomatic and complete 21 day quarantine from outpatient positve covid test. She finished her treatment with dexamethasone and not requiring supplemental oxygen. Touched base with TOC and see if they can have her placed in facility in [facility] as mentioned under consult to [facility].   #Chronic CHF Patient on 20 mg in the morning and 10 mg Lasix in the evening at home.  Lasix has been held. Mild  hyponatremia yesterday morning, will give Lasix am dose.  DVT prophx: Lovenox adjusted for obesity  Diet: Dysphagia 1  Bowel: MiraLAX and senna docusate  Code: DNR  Dispo: Anticipated discharge pending bed offer at SNF.  For questions or orders, please call: [facility], MD Internal Medicine PGY-2  Pager: [phone] After 5pm on weekdays and 1pm on weekends: On Call pager [phone]

## 2020-03-14 NOTE — TOC Progression Note (Addendum)
Transition of Care [facility]) - Progression Note    Patient Details  Name: Veronica [name] MRN: [id number] Date of Birth: 09-21-1935  Transition of Care [facility]) CM/SW Contact  Beckie Busing, RN Phone Number:  [phone] 03/14/2020, 11:21 AM  Clinical Narrative:   CM received call from physician to follow up on patient request to being placed at [facility]. CM called [facility] and spoke with Haydee Monica Admissions Coordinator [phone]) to inquire about admission criteria for patient that has tested Covid positive. Lorene Dy states that the patient can not be considered for admission until she is at least 21 days post positive diagnosis. Patients covid positive results are noted on 03/02/20 which makes the patient only 12 days post positive results. Patients H&P has a 10 day history of diagnosis however CM can not locate result dates  in the patients chart.  Patient can not be considered for admission to [facility] without confirmation of 21 day quarantine time. .Message has been sent to MD.      Expected Discharge Plan: Skilled Nursing Facility (For 5 day respite care) Barriers to Discharge: Continued Medical Work up  Expected Discharge Plan and Services Expected Discharge Plan: Skilled Nursing Facility (For 5 day respite care) In-house Referral: NA Discharge Planning Services: CM Consult Post Acute Care Choice: Skilled Nursing Facility Living arrangements for the past 2 months: Single Family Home                 DME Arranged: N/A DME Agency: NA       HH Arranged: NA HH Agency: NA         Social Determinants of Health (SDOH) Interventions    Readmission Risk Interventions No flowsheet data found.

## 2020-03-15 LAB — BASIC METABOLIC PANEL
Anion gap: 10 (ref 5–15)
BUN: 34 mg/dL — ABNORMAL HIGH (ref 8–23)
CO2: 26 mmol/L (ref 22–32)
Calcium: 8.9 mg/dL (ref 8.9–10.3)
Chloride: 100 mmol/L (ref 98–111)
Creatinine, Ser: 0.95 mg/dL (ref 0.44–1.00)
GFR calc Af Amer: >60 mL/min (ref >60)
GFR calc non Af Amer: 56 mL/min — ABNORMAL LOW (ref >60)
Glucose, Bld: 132 mg/dL — ABNORMAL HIGH (ref 70–99)
Potassium: 4.8 mmol/L (ref 3.5–5.1)
Sodium: 136 mmol/L (ref 135–145)

## 2020-03-15 LAB — GLUCOSE, CAPILLARY
Glucose-Capillary: 96 mg/dL (ref 70–99)
Glucose-Capillary: 109 mg/dL — ABNORMAL HIGH (ref 70–99)
Glucose-Capillary: 113 mg/dL — ABNORMAL HIGH (ref 70–99)
Glucose-Capillary: 106 mg/dL — ABNORMAL HIGH (ref 70–99)

## 2020-03-15 MED ORDER — FUROSEMIDE 20 MG PO TABS
20.0000 mg | ORAL_TABLET | Freq: Every day | ORAL | Status: DC
  Administered 2020-03-15 – 2020-03-18 (×4): 20 mg via ORAL
  Filled 2020-03-15 (×4): qty 1

## 2020-03-15 NOTE — Progress Notes (Signed)
Nutrition Follow-up  DOCUMENTATION CODES:   Not applicable  INTERVENTION:    Continue Ensure Enlive po TID, each supplement provides 350 kcal and 20 grams of protein  Continue MVI daily  NUTRITION DIAGNOSIS:   Increased nutrient needs related to acute illness as evidenced by estimated needs.  Ongoing  GOAL:   Patient will meet greater than or equal to 90% of their needs  Progressing  MONITOR:   PO intake, Supplement acceptance, Weight trends, Labs, I & O's  REASON FOR ASSESSMENT:   Consult Assessment of nutrition requirement/status  ASSESSMENT:   Patient with PMH significant for stroke, CHF, depression, and vascular dementia. Presents this admission with COVID 19 infection.   Intake progressing. Meal completions charted as 45-70%. Drinking 1-2 Ensures daily. Awaiting SNF placement.   Admission weight: 83.1 kg  UOP: 800 ml x 24 hrs   Medications: 20 mg daily, MVI with minerals, miralax, senokot Labs: CBG 96-136  Diet Order:   Diet Order            DIET - DYS 1 Room service appropriate? No; Fluid consistency: Thin  Diet effective now                 EDUCATION NEEDS:   Not appropriate for education at this time  Skin:  Skin Assessment: Skin Integrity Issues: Skin Integrity Issues:: Stage II Stage II: R buttocks  Last BM:  10/4  Height:   Ht Readings from Last 1 Encounters:  03/02/20 4\' 11"  (1.499 m)    Weight:   Wt Readings from Last 1 Encounters:  03/15/20 83.1 kg    BMI:  Body mass index is 37 kg/m.  Estimated Nutritional Needs:   Kcal:  1600-1800 kcal  Protein:  80-95 grams  Fluid:  >/= 1.6 L/day  05/15/20 RD, LDN Clinical Nutrition Pager listed in AMION

## 2020-03-15 NOTE — Progress Notes (Signed)
° °  Subjective:   Veronica [name] is a 84 y.o. with PMH of CHF, stroke with significant cognitive deficits, hypothyroidism, and depression on hosptice admitted for acute hypoxic respiratory failure secondary to Covid-19 Pneumonia on hospital day 13. She tested positive for Covid-19 approximately 10 days before admission.  Patient laying in bed , appears comfortable and drinking her ensure.   Objective:  Vital signs in last 24 hours: Vitals:   03/14/20 1958 03/14/20 2117 03/15/20 0037 03/15/20 0131  BP: (!) 107/57 (!) 121/49    Pulse: 95 91    Resp: 20 18    Temp: 98.5 F (36.9 C) 98.3 F (36.8 C)    TempSrc: Oral Oral    SpO2: 93%     Weight:   83.1 kg 83.1 kg  Height:        Gen: elderly , pleasant, NAD CV: RRR, no murmurs rubs or gallops Resp: unlabored, not tachypnic, saturating well on room air Ext: Trace LEE, stasis changes of the skin   Assessment/Plan:  Principal Problem:   COVID-19 Active Problems:   COVID-19 virus infection   Vascular dementia (HCC)   Chronic CHF (congestive heart failure) (HCC)   Acute hypoxemic respiratory failure due to COVID-19 [facility])   Hypothyroidism   Hypernatremia   AKI (acute kidney injury) (HCC)   Pressure injury of skin  84 year old female with history of CHF, stroke with significant cognitive deficits, hypothyroidism, and depression on hospice presented with acute hypoxic respiratory failure who tested positive for Covid approximately 10 days before admission on 9/22.  # COVID-19 pneumonia ( resolved) Patient was admitted with acute hypoxic respiratory failure secondary to COVID-19 pneumonia. She starting having symptoms and diagnosed with a home test 10 days prior to admission (9/12). She tested positive on admission as well 9/22).Moved out of Covid-19 unit on 10/4, waiting placement in SNF which will only accept her test from the hospital as her quarantine start date. She is asymptomatic and breathing comfortably without supplemental  oxygen.   # Advanced dementia Patient was on hospice care, started 3 weeks before her admission. Her daughter has been her caretaker for the past 19 years. Her daughter also recently had Covid-19 Pneumonia and still recovering. Plan for discharge to a SNF when out of quarantine.   #Chronic CHF Patient on 20 mg in the morning and 10 mg Lasix in the evening at home. Patient initially volume down on admission and lasix held. Started back home dose of lasix on 10/4.  DVT prophx: Lovenox adjusted for obesity  Diet: Dysphagia 1  Bowel: MiraLAX and senna docusate  Code: DNR  Dispo: Anticipated discharge pending bed offer at SNF.  For questions or orders, please call: Thurmon Fair, MD Internal Medicine PGY-2  Pager: [phone] After 5pm on weekdays and 1pm on weekends: On Call pager [phone]

## 2020-03-15 NOTE — Plan of Care (Signed)

## 2020-03-16 ENCOUNTER — Inpatient Hospital Stay (HOSPITAL_COMMUNITY): Payer: Medicare Other

## 2020-03-16 ENCOUNTER — Other Ambulatory Visit: Payer: Self-pay | Admitting: Family Medicine

## 2020-03-16 LAB — GLUCOSE, CAPILLARY: Glucose-Capillary: 120 mg/dL — ABNORMAL HIGH (ref 70–99)

## 2020-03-16 LAB — BASIC METABOLIC PANEL
Anion gap: 14 (ref 5–15)
BUN: 30 mg/dL — ABNORMAL HIGH (ref 8–23)
CO2: 24 mmol/L (ref 22–32)
Calcium: 8.7 mg/dL — ABNORMAL LOW (ref 8.9–10.3)
Chloride: 98 mmol/L (ref 98–111)
Creatinine, Ser: 0.89 mg/dL (ref 0.44–1.00)
GFR calc non Af Amer: >60 mL/min (ref >60)
Glucose, Bld: 116 mg/dL — ABNORMAL HIGH (ref 70–99)
Potassium: 4.6 mmol/L (ref 3.5–5.1)
Sodium: 136 mmol/L (ref 135–145)

## 2020-03-16 NOTE — TOC Progression Note (Addendum)
Transition of Care [facility]) - Progression Note    Patient Details  Name: Veronica [name] MRN: [id number] Date of Birth: 03-26-1936  Transition of Care [facility]) CM/SW Contact  [name], LCSW Phone Number: 03/16/2020, 12:47 PM  Clinical Narrative:   CSW spoke with granddaughter of pt, Veronica [name] who reports that her mother has been DC from the hospital and is back home recovering from covid.  CSW asked for clarification on plan for pt.  Veronica [name] does not believe her mother is able to care for pt any more, even with hospice help.  Veronica [name] also said that previously hospice has said pt is not yet at a place where residential hospice would be an option.  Veronica [name] is not sure what her mother would like to do at this point but did say she could receive a phone call from CSW.  CSW called and LM for [employer], daughter of pt.    1400: CSW spoke with Veronica [name].  She reports she is doing better but still recovering, not able at present to provide care for her mother, but still planning to provide care again as before once she has recovered.  Her understanding is that pt is not currently a candidate for residential hospice.  She is open to considering other SNF facilities for respite but "not [facility]."  1415: CSW spoke with [facility].  She reports that her agency could potentially pay for 6 days of respite and only at SNF facilities that they are contracted with.  She also said that Hospice may have to close the case due to the length of time she has been in the hospital.  The last plan she was aware of was that after pt had been at [facility] for 21 days past her positive covid test, she would go to respite for 6 days and then hopefully the daughter would be able to provide care again.  She will find out if pt is going to be discharged or not and call back.     Expected Discharge Plan: Skilled Nursing Facility (For 5 day respite care) Barriers to Discharge: Continued Medical Work  up  Expected Discharge Plan and Services Expected Discharge Plan: Skilled Nursing Facility (For 5 day respite care) In-house Referral: NA Discharge Planning Services: CM Consult Post Acute Care Choice: Skilled Nursing Facility Living arrangements for the past 2 months: Single Family Home                 DME Arranged: N/A DME Agency: NA       HH Arranged: NA HH Agency: NA         Social Determinants of Health (SDOH) Interventions    Readmission Risk Interventions No flowsheet data found.

## 2020-03-16 NOTE — Progress Notes (Signed)
   Subjective:   Veronica [name] is a 84 year old female with past medical history of CHF, stroke with significant cognitive deficits, hypothyroidism, and depression on hospice admitted for acute hypoxic respiratory failure secondary to Covid-19 Pneumonia on hospital day 14. She tested positive for Covid-19 approximately 10 days before admission.  Overnight, no acute events.  This morning, she was evaluated at bedside, however she was unable to provide any history regarding her current condition.   Objective:  Vital signs in last 24 hours: Vitals:   03/15/20 0743 03/15/20 1700 03/15/20 1952 03/16/20 0122  BP: 139/84 (!) 138/52 (!) 111/49   Pulse: 77 96 96   Resp: (!) 22 17 18    Temp: 97.6 F (36.4 C) 98.2 F (36.8 C) 98 F (36.7 C)   TempSrc:  Oral Oral   SpO2: 95% (!) 89% 93%   Weight:    82.4 kg  Height:        SpO2: 93 % O2 Flow Rate (L/min): 2 L/min  Filed Weights   03/15/20 0037 03/15/20 0131 03/16/20 0122  Weight: 83.1 kg 83.1 kg 82.4 kg    Intake/Output Summary (Last 24 hours) at 03/16/2020 05/16/2020 Last data filed at 03/16/2020 0326 Gross per 24 hour  Intake 237 ml  Output 1050 ml  Net -813 ml   Gen: elderly , pleasant, NAD CV: RRR, no murmurs rubs or gallops Resp: unlabored, not tachypneic, saturating well on room air Ext: Trace LEE, stasis changes of the skin   Assessment/Plan:  Principal Problem:   COVID-19 Active Problems:   COVID-19 virus infection   Vascular dementia (HCC)   Chronic CHF (congestive heart failure) (HCC)   Acute hypoxemic respiratory failure due to COVID-19 (HCC)   Hypothyroidism   Hypernatremia   AKI (acute kidney injury) (HCC)   Pressure injury of skin  84 year old female with history of CHF, stroke with significant cognitive deficits, hypothyroidism, and depression on hospice presented with acute hypoxic respiratory failure who tested positive for Covid approximately 10 days before admission on 9/22.  # COVID-19 pneumonia  (resolved) Patient was admitted with acute hypoxic respiratory failure secondary to COVID-19 pneumonia. She starting having symptoms and diagnosed with a home test 10 days prior to admission (9/12). She tested positive on admission as well 9/22.Moved out of Covid-19 unit on 10/4, waiting placement in SNF which will only accept her test from the hospital as her quarantine start date. She is asymptomatic and breathing comfortably without supplemental oxygen. However, she had frequent cough on evaluation this morning. -Obtain CXR to rule out aspiration  # Advanced dementia, chronic Patient was on hospice care, started 3 weeks before her admission. Her daughter has been her caretaker for the past 19 years. Her daughter also recently had Covid-19 Pneumonia and still recovering. Plan for discharge to a SNF when out of quarantine.   #Chronic CHF Patient on 20 mg in the morning and 10 mg Lasix in the evening at home. Patient initially volume down on admission and lasix held. Started back home dose of lasix on 10/4.  VTE prophx: Lovenox adjusted for obesity Diet: Dysphagia 1 Bowel: MiraLAX and senna docusate Code status: DNR Dispo: Anticipated discharge pending bed offer at SNF.  For questions or orders, please call: 12/4, MD Internal Medicine PGY-1 Pager: [phone] After 5pm on weekdays and 1pm on weekends: On Call pager [phone]

## 2020-03-17 LAB — BASIC METABOLIC PANEL
Anion gap: 12 (ref 5–15)
BUN: 34 mg/dL — ABNORMAL HIGH (ref 8–23)
CO2: 25 mmol/L (ref 22–32)
Calcium: 8.7 mg/dL — ABNORMAL LOW (ref 8.9–10.3)
Chloride: 101 mmol/L (ref 98–111)
Creatinine, Ser: 0.94 mg/dL (ref 0.44–1.00)
GFR calc non Af Amer: 56 mL/min — ABNORMAL LOW (ref >60)
Glucose, Bld: 105 mg/dL — ABNORMAL HIGH (ref 70–99)
Potassium: 4.2 mmol/L (ref 3.5–5.1)
Sodium: 138 mmol/L (ref 135–145)

## 2020-03-17 MED ORDER — ALBUTEROL SULFATE HFA 108 (90 BASE) MCG/ACT IN AERS
1.0000 | INHALATION_SPRAY | Freq: Three times a day (TID) | RESPIRATORY_TRACT | Status: DC
  Administered 2020-03-18 (×2): 2 via RESPIRATORY_TRACT
  Filled 2020-03-17: qty 6.7

## 2020-03-17 MED ORDER — ALBUTEROL SULFATE HFA 108 (90 BASE) MCG/ACT IN AERS
1.0000 | INHALATION_SPRAY | Freq: Four times a day (QID) | RESPIRATORY_TRACT | Status: DC
  Administered 2020-03-17: 2 via RESPIRATORY_TRACT
  Administered 2020-03-17: 1 via RESPIRATORY_TRACT
  Filled 2020-03-17: qty 6.7

## 2020-03-17 MED ORDER — ALBUTEROL SULFATE 2.5 MG/0.5ML IN NEBU: 3.0000 mL | INHALATION_SOLUTION | Freq: Three times a day (TID) | RESPIRATORY_TRACT | Status: DC

## 2020-03-17 NOTE — Progress Notes (Signed)
° °  Subjective:   Veronica [name] is a 84 year old female with past medical history of CHF, stroke with significant cognitive deficits, hypothyroidism, and depression on hospice admitted for acute hypoxic respiratory failure secondary to Covid-19 Pneumonia on hospital day 15. She tested positive for Covid-19 approximately 10 days before admission.  Overnight, no acute events.  This morning, patient says hello. She is alert , but not responding to all questions which is consistent with her level of dementia.   Objective:  Vital signs in last 24 hours: Vitals:   03/16/20 1706 03/16/20 2011 03/16/20 2302 03/17/20 0132  BP: (!) 118/53 127/86    Pulse: 94 (!) 103    Resp: 20 18    Temp: 98.7 F (37.1 C) 98.3 F (36.8 C)    TempSrc:  Axillary    SpO2: 95% 100%    Weight:   82.3 kg 82.3 kg  Height:        SpO2: 100 % O2 Flow Rate (L/min): 2 L/min  Filed Weights   03/16/20 0122 03/16/20 2302 03/17/20 0132  Weight: 82.4 kg 82.3 kg 82.3 kg    Intake/Output Summary (Last 24 hours) at 03/17/2020 0612 Last data filed at 03/17/2020 0355 Gross per 24 hour  Intake --  Output 600 ml  Net -600 ml   Gen: elderly , pleasant, NAD CV: RRR, no murmurs rubs or gallops Resp: unlabored, not tachypnic, unable to cooperate on exam 2/2 dementia, inspiratory wheeze Ext: Trace LEE, stasis changes of the skin   Assessment/Plan:  Principal Problem:   COVID-19 Active Problems:   COVID-19 virus infection   Vascular dementia (HCC)   Chronic CHF (congestive heart failure) (HCC)   Acute hypoxemic respiratory failure due to COVID-19 (HCC)   Hypothyroidism   Hypernatremia   AKI (acute kidney injury) (HCC)   Pressure injury of skin  Ms. Veronica [name] is a 84 year old female with past medical history of CHF, stroke with significant cognitive deficits, hypothyroidism, and depression on hospice presented with acute hypoxic respiratory failure who tested positive for Covid approximately 10 days  before admission on 9/22.  # COVID-19 pneumonia (resolved) Patient was admitted with acute hypoxic respiratory failure secondary to COVID-19 pneumonia. She starting having symptoms and diagnosed with a home test 10 days prior to admission (9/12). She tested positive on admission as well 9/22.Moved out of Covid-19 unit on 10/4, waiting placement in SNF which will only accept her test from the hospital as her quarantine start date. She is asymptomatic and breathing comfortably without supplemental oxygen. Repeat CXR obtained on 03/17/20 which was unremarkable. Appreciated upper airway wheeze this morning, will schedule her albuterol.  - SW is working on discharge plan  # Advanced dementia, chronic Patient was on hospice care, started 3 weeks before her admission. Her daughter has been her caretaker for the past 19 years. Her daughter also recently had Covid-19 Pneumonia and still recovering. Plan for discharge to a SNF when out of quarantine.  #Chronic CHF Patient on 20 mg in the morning and 10 mg Lasix in the evening at home. Patient initially volume down on admission and lasix held. Started back home dose of lasix on 10/4.  VTE prophx: Lovenox adjusted for obesity Diet: Dysphagia 1 Bowel: MiraLAX and senna docusate Code status: DNR Dispo: Anticipated discharge pending bed offer at SNF.  For questions or orders, please call: Thurmon Fair, MD Internal Medicine PGY-2 Pager: [phone] After 5pm on weekdays and 1pm on weekends: On Call pager [phone]

## 2020-03-17 NOTE — TOC Progression Note (Addendum)
Transition of Care [facility]) - Progression Note    Patient Details  Name: Veronica [name] MRN: [id number] Date of Birth: Jul 14, 1935  Transition of Care [facility]) CM/SW Contact  [name], LCSW Phone Number: 03/17/2020, 12:56 PM  Clinical Narrative:  CSW received call from [place].  Hospice will not be discharging pt.  Katelyn spoke to Lee Mont at [facility] [place], [phone] and they have a respite bed available currently.  Konrad Felix also spoke with pt daughter Girard Cooter who would like to have pt complete the respite and then wants her to return home.  CSW called, LM with [facility].   1535: CSW made two more attempts to reach [facility], left second message.    Expected Discharge Plan: Skilled Nursing Facility (For 5 day respite care) Barriers to Discharge: Other (comment) (needs SNF respite bed)  Expected Discharge Plan and Services Expected Discharge Plan: Skilled Nursing Facility (For 5 day respite care) In-house Referral: NA Discharge Planning Services: CM Consult Post Acute Care Choice: Skilled Nursing Facility Living arrangements for the past 2 months: Single Family Home                 DME Arranged: N/A DME Agency: NA       HH Arranged: NA HH Agency: NA         Social Determinants of Health (SDOH) Interventions    Readmission Risk Interventions No flowsheet data found.

## 2020-03-18 LAB — BASIC METABOLIC PANEL
Anion gap: 8 (ref 5–15)
BUN: 35 mg/dL — ABNORMAL HIGH (ref 8–23)
CO2: 29 mmol/L (ref 22–32)
Calcium: 8.6 mg/dL — ABNORMAL LOW (ref 8.9–10.3)
Chloride: 100 mmol/L (ref 98–111)
Creatinine, Ser: 0.92 mg/dL (ref 0.44–1.00)
GFR calc non Af Amer: 58 mL/min — ABNORMAL LOW (ref >60)
Glucose, Bld: 89 mg/dL (ref 70–99)
Potassium: 4.8 mmol/L (ref 3.5–5.1)
Sodium: 137 mmol/L (ref 135–145)

## 2020-03-18 MED ORDER — ALBUTEROL SULFATE HFA 108 (90 BASE) MCG/ACT IN AERS
1.0000 | INHALATION_SPRAY | Freq: Four times a day (QID) | RESPIRATORY_TRACT | Status: DC | PRN
  Filled 2020-03-18: qty 6.7

## 2020-03-18 NOTE — Progress Notes (Signed)
Attempted X's 2 to give report to nurse at [facility], no answer.

## 2020-03-18 NOTE — Progress Notes (Addendum)
   Subjective:   Veronica [name] is a 84 year old female with past medical history of CHF, stroke with significant cognitive deficits, hypothyroidism, and depression on hospice admitted for acute hypoxic respiratory failure secondary to Covid-19 Pneumonia on hospital day 16. She tested positive for Covid-19 approximately 10 days before admission.  Overnight, no acute events.  This morning, patient is sleeping sideways in bed. We straightened her and she was alert during exam. She has eaten most of her breakfast present bedside.   Objective:  Vital signs in last 24 hours: Vitals:   03/17/20 0736 03/17/20 1656 03/17/20 1926 03/17/20 2342  BP: (!) 106/52 (!) 123/52  (!) 118/51  Pulse: 77 89  91  Resp: 20 20  16   Temp: 98.1 F (36.7 C) 98.2 F (36.8 C)  98.9 F (37.2 C)  TempSrc: Oral Oral    SpO2: 93% 95% 93% 95%  Weight:      Height:        SpO2: 95 % O2 Flow Rate (L/min): 2 L/min  Filed Weights   03/16/20 0122 03/16/20 2302 03/17/20 0132  Weight: 82.4 kg 82.3 kg 82.3 kg    Intake/Output Summary (Last 24 hours) at 03/18/2020 0641 Last data filed at 03/17/2020 1804 Gross per 24 hour  Intake --  Output 550 ml  Net -550 ml   Gen: elderly , pleasant, NAD CV: RRR, no murmurs rubs or gallops Resp: unlabored, not tachypnic, unable to cooperate on exam 2/2 dementia, no wheezing  Ext: Trace LEE, stasis changes of the skin   Assessment/Plan:  Principal Problem:   COVID-19 Active Problems:   COVID-19 virus infection   Vascular dementia (HCC)   Chronic CHF (congestive heart failure) (HCC)   Acute hypoxemic respiratory failure due to COVID-19 (HCC)   Hypothyroidism   Hypernatremia   AKI (acute kidney injury) (HCC)   Pressure injury of skin  Veronica [name] is a 84 year old female with past medical history of CHF, stroke with significant cognitive deficits, hypothyroidism, and depression on hospice presented with acute hypoxic respiratory failure who tested positive  for Covid approximately 10 days before admission on 9/22.  # COVID-19 pneumonia (resolved) Patient was admitted with acute hypoxic respiratory failure secondary to COVID-19 pneumonia. She starting having symptoms and diagnosed with a home test 10 days prior to admission (9/12). She tested positive on admission as well 9/22.Moved out of Covid-19 unit on 10/4, waiting placement in SNF which will only accept her test from the hospital as her quarantine start date. She is asymptomatic and breathing comfortably without supplemental oxygen. Repeat CXR obtained on 03/17/20 which was unremarkable. Wheezing improved today on exam.  - SW is working on discharge plan, possible discharge today.   # Advanced dementia, chronic  Chronic, stable   #Chronic CHF  Patient on 20 mg in the morning and 10 mg Lasix in the evening at home. Patient initially volume down on admission and lasix held. Started back home dose of lasix on 10/4.  VTE prophx: Lovenox adjusted for obesity Diet: Dysphagia 1 Bowel: MiraLAX and senna docusate Code status: DNR Dispo: Anticipated discharge pending bed offer at SNF.  For questions or orders, please call: 12/4, MD Internal Medicine PGY-2 Pager: [phone] After 5pm on weekdays and 1pm on weekends: On Call pager [phone]

## 2020-03-18 NOTE — TOC Progression Note (Signed)
Transition of Care [facility]) - Progression Note    Patient Details  Name: Veronica [name] MRN: [id number] Date of Birth: 02-05-1936  Transition of [facility]) CM/SW Contact  [name], LCSW Phone Number: 03/18/2020, 10:40 AM  Clinical Narrative:   CSW spoke with [name] at [facility], faxed requested information to her.  [name] has spoken to [name] from [facility] as well and is getting paperwork.  Possible bed today.  CSW updated [place] [facility] with respite dates as PASSR not yet issued and needed these dates per last message.    Expected Discharge Plan: Skilled Nursing Facility (For 5 day respite care) Barriers to Discharge: Other (comment) (needs SNF respite bed)  Expected Discharge Plan and Services Expected Discharge Plan: Skilled Nursing Facility (For 5 day respite care) In-house Referral: NA Discharge Planning Services: CM Consult Post Acute Care Choice: Skilled Nursing Facility Living arrangements for the past 2 months: Single Family Home                 DME Arranged: N/A DME Agency: NA       HH Arranged: NA HH Agency: NA         Social Determinants of Health (SDOH) Interventions    Readmission Risk Interventions No flowsheet data found.

## 2020-03-18 NOTE — NC FL2 (Signed)
[place] MEDICAID FL2 LEVEL OF CARE SCREENING TOOL     IDENTIFICATION  Patient Name: Veronica [name] Birthdate: March 23, 1936 Sex: female Admission Date (Current Location): 03/02/2020  [facility] and [place] Number:  [employer] and Address:  [facility] [place], [address], [place], [place] [address]      Provider Number: [id number]  Attending Physician Name and Address:  Earl Lagos, MD  Relative Name and Phone Number:  Girard Cooter [phone]    Current Level of Care: Hospital Recommended Level of Care: Other (Comment) (SNF respite care) Prior Approval Number:    Date Approved/Denied:   PASRR Number:    Discharge Plan: SNF (Respite)    Current Diagnoses: Patient Active Problem List   Diagnosis Date Noted  . Pressure injury of skin 03/04/2020  . COVID-19 03/02/2020  . COVID-19 virus infection 03/02/2020  . Chronic CHF (congestive heart failure) (HCC) 03/02/2020  . Acute hypoxemic respiratory failure due to COVID-19 (HCC) 03/02/2020  . Hypothyroidism 03/02/2020  . Hypernatremia 03/02/2020  . AKI (acute kidney injury) (HCC) 03/02/2020  . Vascular dementia (HCC)   . Acute sinusitis 01/18/2020    Orientation RESPIRATION BLADDER Height & Weight     Self  O2 (2L) Incontinent Weight: 181 lb 7 oz (82.3 kg) Height:  4\' 11"  (149.9 cm)  BEHAVIORAL SYMPTOMS/MOOD NEUROLOGICAL BOWEL NUTRITION STATUS     (n/a) Incontinent Diet (Dysphagia/ thin liquids)  AMBULATORY STATUS COMMUNICATION OF NEEDS Skin   Total Care Verbally Normal                       Personal Care Assistance Level of Assistance  Bathing, Feeding, Dressing, Total care Bathing Assistance: Maximum assistance Feeding assistance: Maximum assistance Dressing Assistance: Maximum assistance Total Care Assistance: Maximum assistance   Functional Limitations Info  Sight, Hearing, Speech Sight Info: Adequate Hearing Info: Adequate Speech Info: Adequate    SPECIAL CARE  FACTORS FREQUENCY  PT (By licensed PT), OT (By licensed OT)     PT Frequency: 5X OT Frequency: 5X            Contractures Contractures Info: Not present    Additional Factors Info  Code Status, Psychotropic, Allergies, Insulin Sliding Scale, Isolation Precautions, Suctioning Needs Code Status Info: DNR Allergies Info: cheese, chocolate Psychotropic Info: Hx of depression  see d/c summary for any psychotropic meds Insulin Sliding Scale Info: see d/c summary for any ss info Isolation Precautions Info: Airborne/ contact COVID positive patient Suctioning Needs: n/a   Current Medications (03/18/2020):  This is the current hospital active medication list Current Facility-Administered Medications  Medication Dose Route Frequency Provider Last Rate Last Admin  . acetaminophen (TYLENOL) suppository 650 mg  650 mg Rectal Q6H PRN Rehman, Areeg N, DO      . acetaminophen (TYLENOL) tablet 650 mg  650 mg Oral Q6H PRN Rehman, Areeg N, DO   650 mg at 03/03/20 2117  . albuterol (VENTOLIN HFA) 108 (90 Base) MCG/ACT inhaler 1-2 puff  1-2 puff Inhalation TID 2118, MD   2 puff at 03/18/20 1408  . chlorpheniramine-HYDROcodone (TUSSIONEX) 10-8 MG/5ML suspension 5 mL  5 mL Oral Q12H PRN Rehman, Areeg N, DO   5 mL at 03/11/20 1056  . citalopram (CELEXA) tablet 20 mg  20 mg Oral Daily Rehman, Areeg N, DO   20 mg at 03/18/20 0921  . clopidogrel (PLAVIX) tablet 75 mg  75 mg Oral Daily Rehman, Areeg N, DO   75 mg at  03/18/20 0921  . enoxaparin (LOVENOX) injection 45 mg  45 mg Subcutaneous Q24H Rehman, Areeg N, DO   45 mg at 03/17/20 1658  . feeding supplement (ENSURE ENLIVE) (ENSURE ENLIVE) liquid 237 mL  237 mL Oral TID BM Carlynn Purl C, DO   237 mL at 03/18/20 1412  . furosemide (LASIX) tablet 20 mg  20 mg Oral Daily Albertha Ghee, MD   20 mg at 03/18/20 0921  . guaiFENesin (ROBITUSSIN) 100 MG/5ML solution 100 mg  5 mL Oral Q4H PRN Rehman, Areeg N, DO   100 mg at 03/03/20 2117  . levothyroxine  (SYNTHROID) tablet 125 mcg  125 mcg Oral Q24H Rehman, Areeg N, DO   125 mcg at 03/18/20 0624  . multivitamin with minerals tablet 1 tablet  1 tablet Oral Daily Gust Rung, DO   1 tablet at 03/18/20 8938  . ondansetron (ZOFRAN) tablet 4 mg  4 mg Oral Q6H PRN Rehman, Areeg N, DO       Or  . ondansetron (ZOFRAN) injection 4 mg  4 mg Intravenous Q6H PRN Rehman, Areeg N, DO      . polyethylene glycol (MIRALAX / GLYCOLAX) packet 17 g  17 g Oral Daily Verdene Lennert, MD   17 g at 03/18/20 0921  . senna-docusate (Senokot-S) tablet 1 tablet  1 tablet Oral QHS Verdene Lennert, MD   1 tablet at 03/17/20 2107     Discharge Medications: Please see discharge summary for a list of discharge medications.  Relevant Imaging Results:  Relevant Lab Results:   Additional Information SSN [id number]   COVID positive patient  [name], LCSW

## 2020-03-18 NOTE — TOC Transition Note (Signed)
Transition of Care [facility]) - CM/SW Discharge Note   Patient Details  Name: Veronica [name] MRN: [id number] Date of Birth: 03-08-1936  Transition of Care [facility]) CM/SW Contact:  [name], LCSW Phone Number: 03/18/2020, 4:46 PM   Clinical Narrative:   Pt to transfer to [facility], [address], [place], [place] [address] for respite care prior to return home with daughter. RN call report to [phone].  [facility] aware.  Pt daughter Veronica [name] notified.  [name] is contact at [facility] cell [phone].      Final next level of care: Skilled Nursing Facility Barriers to Discharge: Barriers Resolved   Patient Goals and CMS Choice Patient states their goals for this hospitalization and ongoing recovery are:: Patient is unable to communicate CMS Medicare.gov Compare Post Acute Care list provided to:: Patient Represenative (must comment) (daughter) Choice offered to / list presented to : Adult Children  Discharge Placement              Patient chooses bed at:  [facility])   Name of family member notified: Veronica [name], daughter Patient and family notified of of transfer: 03/18/20  Discharge Plan and Services In-house Referral: NA Discharge Planning Services: CM Consult Post Acute Care Choice: Skilled Nursing Facility          DME Arranged: N/A DME Agency: NA       HH Arranged: NA HH Agency: NA        Social Determinants of Health (SDOH) Interventions     Readmission Risk Interventions No flowsheet data found.

## 2020-03-18 NOTE — Consult Note (Signed)
   [facility] CM Inpatient Consult   03/18/2020  Veronica [name] 07/17/1935 [id number]   [place] [facility] [ACO] Patient: [employer]   Patient screened for length of stay hospitalizations to check if potential [place] Management service needs.  Review of patient's medical record reveals patient is for a skilled nursing facility transition for respite care.  Primary Care Provider is  Veronica Brome, MD  this provider is an [place] practice with pharmacy and  listed to provide the transition of care [TOC] for post hospital follow up.  Plan: No [facility] Care Management follow up planned at patient needs are to be met at a respite level of care [if and when patient transitions there]. Will sign off at transition.  For questions contact:   Veronica Brood, RN BSN [facility] Liaison  [phone] business mobile phone Toll free office [phone]  Fax number: [phone] [email or web address]

## 2020-03-23 DIAGNOSIS — U071 COVID-19: Secondary | ICD-10-CM | POA: Diagnosis not present

## 2020-04-13 ENCOUNTER — Other Ambulatory Visit: Payer: Self-pay | Admitting: Family Medicine

## 2020-04-28 ENCOUNTER — Other Ambulatory Visit: Payer: Self-pay

## 2020-04-28 MED ORDER — HYDROCODONE-ACETAMINOPHEN 5-325 MG PO TABS
1.0000 | ORAL_TABLET | ORAL | 0 refills | Status: DC
Start: 2020-04-28 — End: 2021-07-03

## 2020-04-28 NOTE — Telephone Encounter (Signed)
Katie with [facility] called requesting a rx be sent in for moderate pain. [phone]

## 2020-05-09 ENCOUNTER — Other Ambulatory Visit: Payer: Self-pay | Admitting: Family Medicine

## 2020-05-18 ENCOUNTER — Other Ambulatory Visit: Payer: Self-pay | Admitting: Family Medicine

## 2020-07-19 ENCOUNTER — Telehealth: Payer: Self-pay

## 2020-07-19 NOTE — Telephone Encounter (Signed)
Katie with [facility] called stating that patients bp over the past couple of days has been 180's/90's. Reports today her bp was 182/94. Per Dr. Sedalia Muta called Katie back to see if patient is in pain or has been sick. Depending on this answer would determine if she was going to send in a medication to the pharmacy (need to verify which pharmacy). Also if possible she wanted HH to draw a CBC, CMP and TSH. LVM for Katie to call our office back.

## 2020-07-20 ENCOUNTER — Telehealth: Payer: Self-pay

## 2020-07-20 ENCOUNTER — Other Ambulatory Visit: Payer: Self-pay | Admitting: Family Medicine

## 2020-07-20 MED ORDER — LOSARTAN POTASSIUM 50 MG PO TABS: 50.0000 mg | ORAL_TABLET | Freq: Every day | ORAL | 0 refills | Status: DC

## 2020-07-20 NOTE — Telephone Encounter (Signed)
Sent losartan 50 mg once daily. Pt needs to have video visit in 3-4 weeks.

## 2020-07-20 NOTE — Telephone Encounter (Signed)
Spoke with Santiago Bumpers home health nurse, informed her that you would be sending a rx for patient to [facility] in [place]. She also stated that they are unable to draw labs.

## 2020-08-01 ENCOUNTER — Other Ambulatory Visit: Payer: Self-pay | Admitting: Family Medicine

## 2020-08-02 ENCOUNTER — Other Ambulatory Visit: Payer: Self-pay | Admitting: Family Medicine

## 2020-08-07 IMAGING — CR CHEST - 2 VIEW
2 series · 2 of 2 positions shown · non-contrast
Comparison: 08/05/2018

CLINICAL DATA: Chest congestion.  Hx CHF.

EXAM:
CHEST - 2 VIEW

[chest lat]
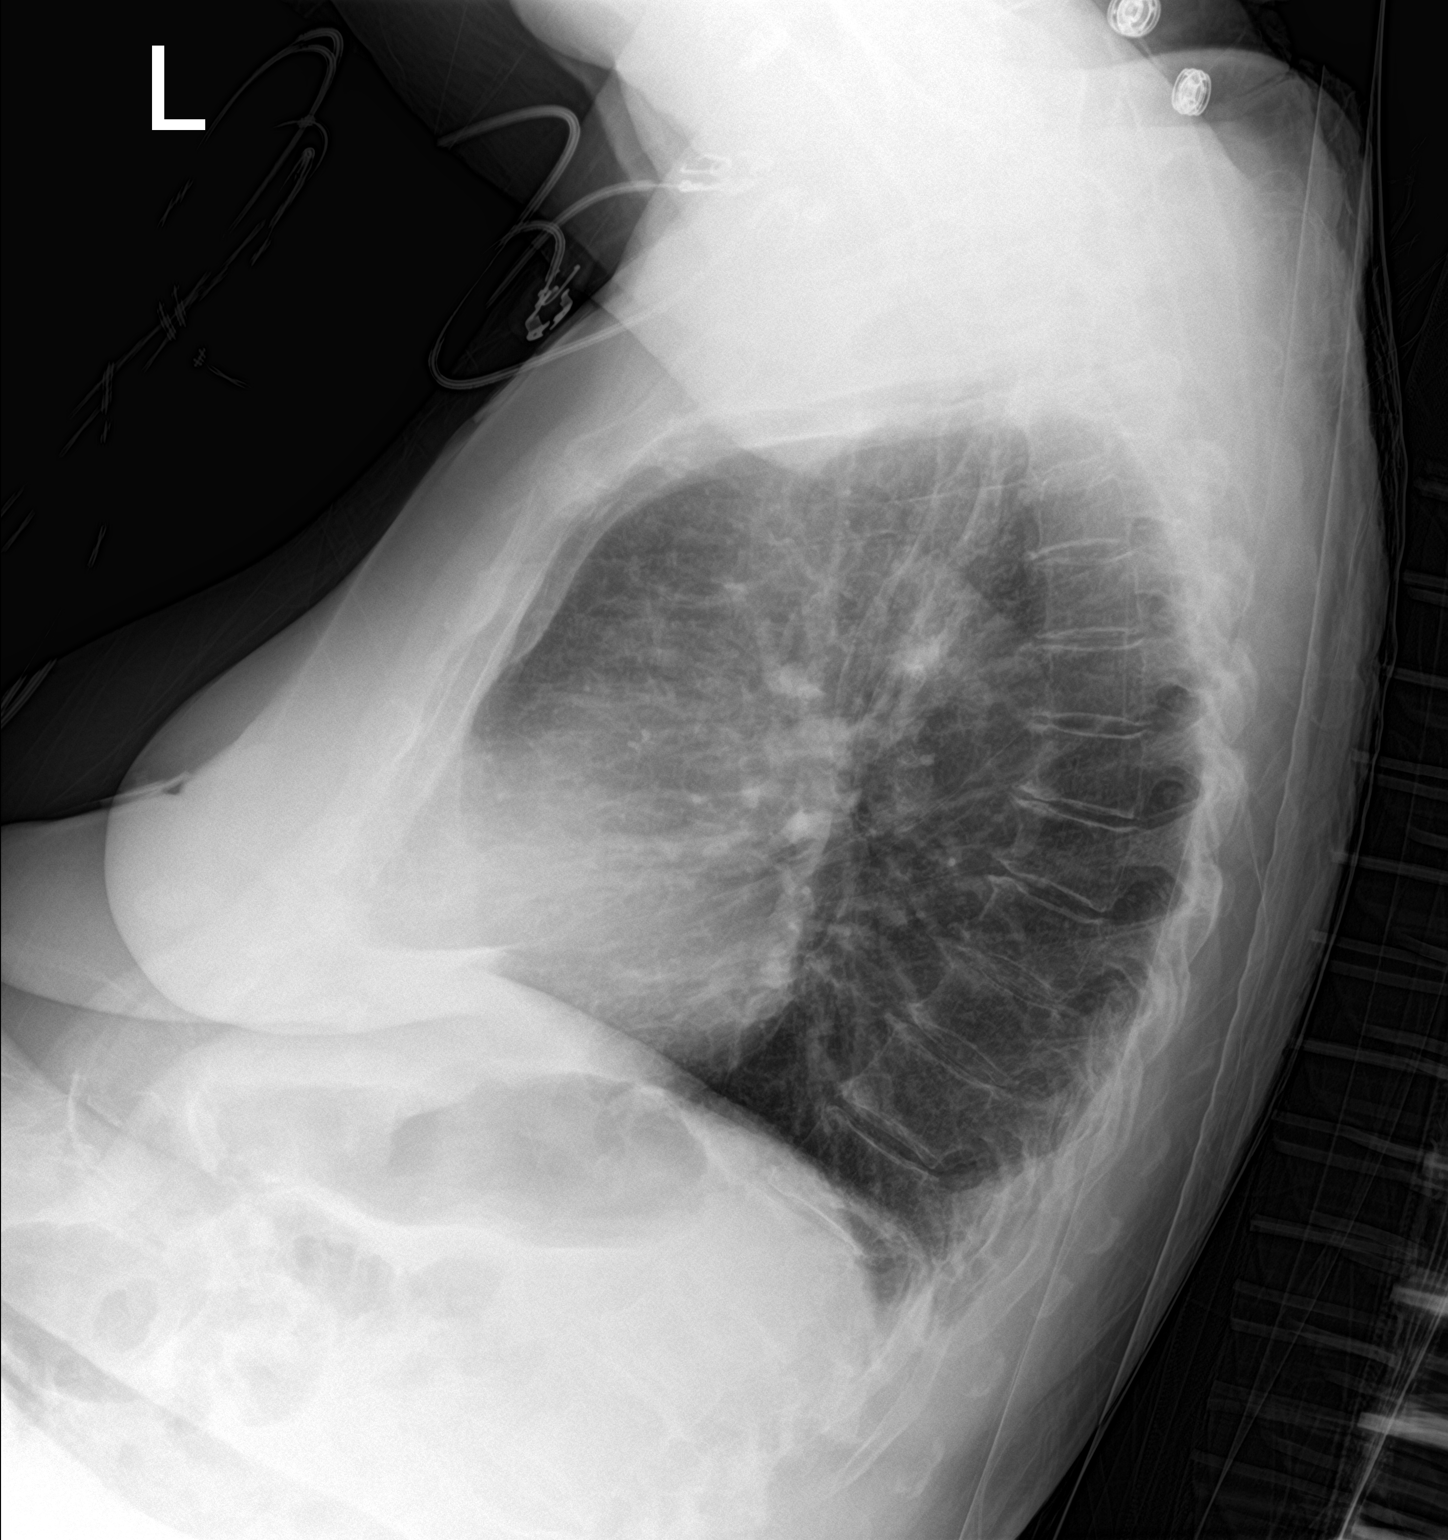

[chest ap]
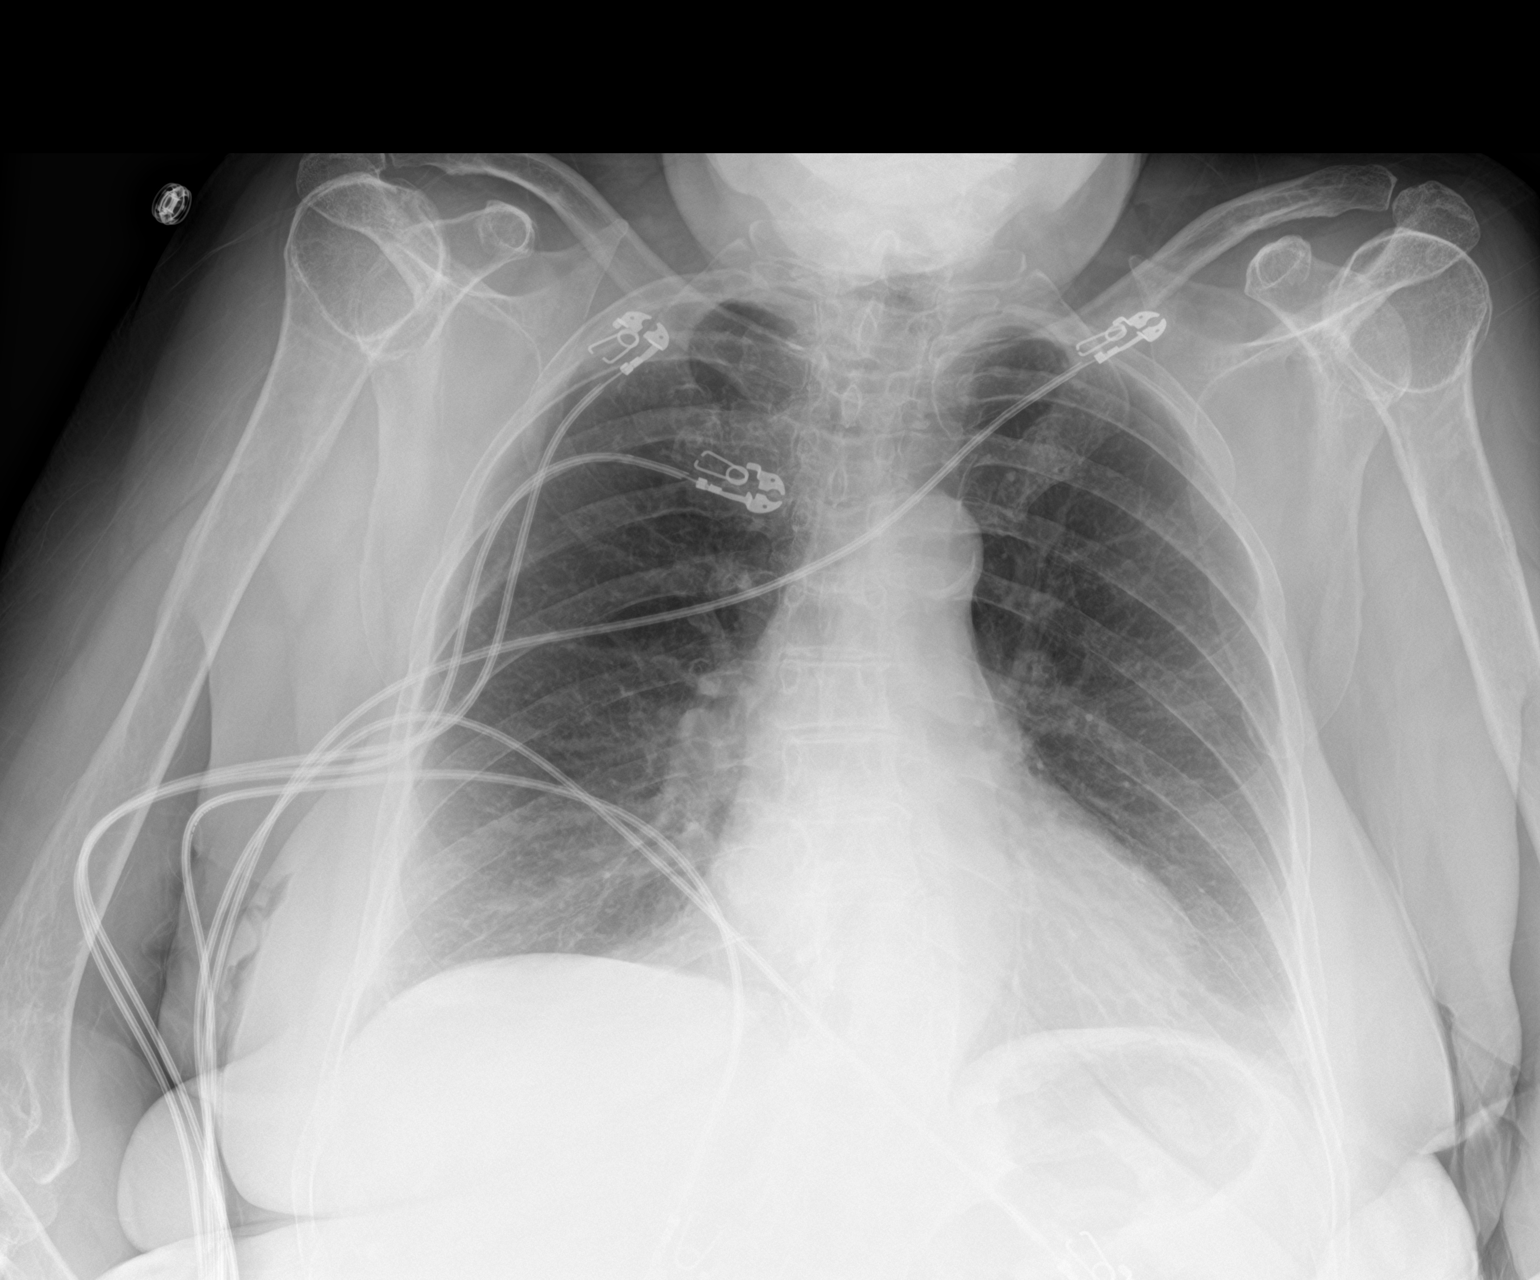

[2 of 2 positions shown; findings below may reference images not displayed]

FINDINGS: Heart size is normal. The aorta is atherosclerotic. Lungs are free
of focal consolidations and pleural effusions. No edema.
IMPRESSION: No evidence for acute cardiopulmonary abnormality.

Aortic atherosclerosis.  ([id number])

## 2020-08-15 ENCOUNTER — Other Ambulatory Visit: Payer: Self-pay | Admitting: Family Medicine

## 2020-08-16 ENCOUNTER — Telehealth: Payer: Self-pay

## 2020-08-16 NOTE — Telephone Encounter (Signed)
Veronica [name] with Hospice left VM stating they re-certified pt for another 60 days.   Lorita Officer, [place] 08/16/20 4:08 PM

## 2020-08-17 ENCOUNTER — Other Ambulatory Visit: Payer: Self-pay | Admitting: Family Medicine

## 2020-09-13 ENCOUNTER — Other Ambulatory Visit: Payer: Self-pay | Admitting: Family Medicine

## 2020-09-20 ENCOUNTER — Other Ambulatory Visit: Payer: Self-pay | Admitting: Family Medicine

## 2020-10-03 ENCOUNTER — Other Ambulatory Visit: Payer: Self-pay | Admitting: Family Medicine

## 2020-10-17 ENCOUNTER — Other Ambulatory Visit: Payer: Self-pay | Admitting: Physician Assistant

## 2020-10-22 DIAGNOSIS — S31819D Unspecified open wound of right buttock, subsequent encounter: Secondary | ICD-10-CM | POA: Insufficient documentation

## 2020-10-22 DIAGNOSIS — I69320 Aphasia following cerebral infarction: Secondary | ICD-10-CM | POA: Insufficient documentation

## 2020-10-22 DIAGNOSIS — E034 Atrophy of thyroid (acquired): Secondary | ICD-10-CM | POA: Insufficient documentation

## 2020-11-01 ENCOUNTER — Other Ambulatory Visit: Payer: Self-pay | Admitting: Family Medicine

## 2020-11-15 ENCOUNTER — Other Ambulatory Visit: Payer: Self-pay | Admitting: Family Medicine

## 2020-11-28 ENCOUNTER — Other Ambulatory Visit: Payer: Self-pay | Admitting: Family Medicine

## 2020-11-29 ENCOUNTER — Other Ambulatory Visit: Payer: Self-pay | Admitting: Physician Assistant

## 2020-12-07 ENCOUNTER — Other Ambulatory Visit: Payer: Self-pay | Admitting: Physician Assistant

## 2020-12-14 ENCOUNTER — Other Ambulatory Visit: Payer: Self-pay | Admitting: Family Medicine

## 2020-12-14 ENCOUNTER — Telehealth: Payer: Self-pay

## 2020-12-14 NOTE — Telephone Encounter (Signed)
I called Barbara Cower to let him know that Dr Sedalia Muta preferred that Hospice take care of the patient because she is not able to come to the office and Dr Sedalia Muta has not see her in long time. Barbara Cower said that he will fill out the papers.

## 2020-12-14 NOTE — Telephone Encounter (Signed)
This encounter was created in error - please disregard.

## 2020-12-28 ENCOUNTER — Other Ambulatory Visit: Payer: Self-pay | Admitting: Physician Assistant

## 2021-01-09 ENCOUNTER — Other Ambulatory Visit: Payer: Self-pay | Admitting: Family Medicine

## 2021-01-25 ENCOUNTER — Telehealth: Payer: Self-pay

## 2021-01-25 NOTE — Telephone Encounter (Signed)
Barbara Cower, hospice nurse, calling requesting Dr. Sedalia Muta to become primary physician over pt. States pt is stable and may not be on hospice much longer. He did state pt is bed bound and transport is hard for pt, pt was dropped by EMS last time transported by them. Made Barbara Cower aware would ask provider if this would be possible and return call. Please advise.   Callback number: [phone]

## 2021-01-26 ENCOUNTER — Other Ambulatory Visit: Payer: Self-pay | Admitting: Family Medicine

## 2021-01-27 NOTE — Telephone Encounter (Signed)
Veronica [name], hospice nurse, left VM requesting Dr. Sedalia Muta take over care.   Spoke w/ Veronica [name], daughter is aware pt will need 3 month fu via video visits. She is to check on home health.   Called Veronica [name] to update. Made him aware pt would need 3 month fu visits via video. He did state they would be able to do this. He did not know about home health he was requesting Dr take over chronic care for as pt is home bound. Stated we would like pt to have home health if possible and daughter is aware to check on this.   Veronica [name] will be speaking w/ daughter again after daughter meets w/ hospice at 10 am.   [name] 01/27/21 9:54 AM

## 2021-01-29 ENCOUNTER — Other Ambulatory Visit: Payer: Self-pay | Admitting: Family Medicine

## 2021-02-01 ENCOUNTER — Other Ambulatory Visit: Payer: Self-pay | Admitting: Family Medicine

## 2021-02-08 ENCOUNTER — Other Ambulatory Visit: Payer: Self-pay | Admitting: Family Medicine

## 2021-02-23 ENCOUNTER — Other Ambulatory Visit: Payer: Self-pay | Admitting: Physician Assistant

## 2021-03-01 ENCOUNTER — Other Ambulatory Visit: Payer: Self-pay | Admitting: Family Medicine

## 2021-03-20 ENCOUNTER — Other Ambulatory Visit: Payer: Self-pay | Admitting: Family Medicine

## 2021-03-21 NOTE — Telephone Encounter (Signed)
Refill sent to pharmacy.   

## 2021-03-23 ENCOUNTER — Telehealth: Payer: Self-pay

## 2021-03-23 NOTE — Telephone Encounter (Signed)
[place] called about her Mother Mrs. Suzie Portela.  She currently is under Hospice Care but she thinks she is going to be discharged from their care at the end of the month.  She is calling about Dr. Sedalia Muta resuming her care.  I explained that we would need to get her in for appointments but that had been the problem in the past.  Transporting the patient to the office was very difficult in the past.  Her daughter is going to talk with the Hospice Social Worker and the nurse to see what they would recommend for future care.

## 2021-03-24 ENCOUNTER — Other Ambulatory Visit: Payer: Self-pay | Admitting: Family Medicine

## 2021-03-30 NOTE — Telephone Encounter (Signed)
Working on options.  Considering a company called [employer] that will send a provider to the home. That provider works with me.  Dr. Sedalia Muta

## 2021-04-10 ENCOUNTER — Telehealth: Payer: Self-pay

## 2021-04-10 NOTE — Telephone Encounter (Signed)
Mrs. Stannard daughter called about her Mom seeing Dr. Sedalia Muta.  She is going to be released from [facility] and she needs to return to in house visits with Dr. Sedalia Muta. They have a company that can transport her in a W/C.  She is going to need a hoyer lift but I think that she will need to be seen before her insurance will approve the order.  I called and left a message for her to call us back.

## 2021-04-11 ENCOUNTER — Other Ambulatory Visit: Payer: Self-pay | Admitting: Family Medicine

## 2021-04-19 ENCOUNTER — Other Ambulatory Visit: Payer: Self-pay

## 2021-04-19 MED ORDER — PREDNISONE 10 MG PO TABS
10.0000 mg | ORAL_TABLET | Freq: Every day | ORAL | 1 refills | Status: DC
Start: 2021-04-19 — End: 2021-04-25

## 2021-04-19 MED ORDER — CITALOPRAM HYDROBROMIDE 20 MG PO TABS
20.0000 mg | ORAL_TABLET | Freq: Every day | ORAL | 0 refills | Status: DC
Start: 2021-04-19 — End: 2021-04-25

## 2021-04-25 ENCOUNTER — Ambulatory Visit (INDEPENDENT_AMBULATORY_CARE_PROVIDER_SITE_OTHER): Payer: Medicare Other | Admitting: Family Medicine

## 2021-04-25 ENCOUNTER — Encounter: Payer: Self-pay | Admitting: Family Medicine

## 2021-04-25 VITALS — BP 124/64 | HR 96 | Temp 98.6°F

## 2021-04-25 DIAGNOSIS — R3981 Functional urinary incontinence: Secondary | ICD-10-CM | POA: Diagnosis not present

## 2021-04-25 DIAGNOSIS — R4701 Aphasia: Secondary | ICD-10-CM | POA: Insufficient documentation

## 2021-04-25 DIAGNOSIS — E039 Hypothyroidism, unspecified: Secondary | ICD-10-CM

## 2021-04-25 DIAGNOSIS — I509 Heart failure, unspecified: Secondary | ICD-10-CM | POA: Diagnosis not present

## 2021-04-25 DIAGNOSIS — R1312 Dysphagia, oropharyngeal phase: Secondary | ICD-10-CM | POA: Diagnosis not present

## 2021-04-25 DIAGNOSIS — L89301 Pressure ulcer of unspecified buttock, stage 1: Secondary | ICD-10-CM

## 2021-04-25 DIAGNOSIS — R159 Full incontinence of feces: Secondary | ICD-10-CM | POA: Diagnosis not present

## 2021-04-25 DIAGNOSIS — R6889 Other general symptoms and signs: Secondary | ICD-10-CM | POA: Diagnosis not present

## 2021-04-25 DIAGNOSIS — I69351 Hemiplegia and hemiparesis following cerebral infarction affecting right dominant side: Secondary | ICD-10-CM

## 2021-04-25 DIAGNOSIS — F01B0 Vascular dementia, moderate, without behavioral disturbance, psychotic disturbance, mood disturbance, and anxiety: Secondary | ICD-10-CM

## 2021-04-25 MED ORDER — FUROSEMIDE 20 MG PO TABS
20.0000 mg | ORAL_TABLET | Freq: Every day | ORAL | 1 refills | Status: DC
Start: 2021-04-25 — End: 2021-07-03

## 2021-04-25 MED ORDER — CITALOPRAM HYDROBROMIDE 20 MG PO TABS: 20.0000 mg | ORAL_TABLET | Freq: Every day | ORAL | 1 refills | Status: DC

## 2021-04-25 MED ORDER — LEVOTHYROXINE SODIUM 125 MCG PO TABS: 125.0000 ug | ORAL_TABLET | Freq: Every day | ORAL | 1 refills | Status: DC

## 2021-04-25 MED ORDER — PREDNISONE 10 MG PO TABS: 10.0000 mg | ORAL_TABLET | Freq: Every day | ORAL | 1 refills | Status: DC

## 2021-04-25 MED ORDER — CLOPIDOGREL BISULFATE 75 MG PO TABS
75.0000 mg | ORAL_TABLET | Freq: Every day | ORAL | 1 refills | Status: DC
Start: 2021-04-25 — End: 2021-07-03

## 2021-04-25 MED ORDER — AMOXICILLIN 400 MG/5ML PO SUSR: 500.0000 mg | Freq: Three times a day (TID) | ORAL | 0 refills | Status: DC

## 2021-04-25 MED ORDER — IPRATROPIUM-ALBUTEROL 0.5-2.5 (3) MG/3ML IN SOLN: RESPIRATORY_TRACT | 3 refills | Status: DC

## 2021-04-25 MED ORDER — LORATADINE 10 MG PO TABS
ORAL_TABLET | ORAL | 1 refills | Status: DC
Start: 2021-04-25 — End: 2021-07-04

## 2021-04-25 NOTE — Assessment & Plan Note (Signed)
Needs thickened liquids.

## 2021-04-25 NOTE — Assessment & Plan Note (Signed)
Continue lasix 20 mg once daily.  ?

## 2021-04-25 NOTE — Assessment & Plan Note (Signed)
Needs depends 5/day.

## 2021-04-25 NOTE — Assessment & Plan Note (Signed)
Continue plavix °

## 2021-04-25 NOTE — Assessment & Plan Note (Signed)
Continue synthroid 125 mcg once daily in am.  ?

## 2021-04-25 NOTE — Assessment & Plan Note (Signed)
Able to communicate with Daughter somewhat.

## 2021-04-25 NOTE — Progress Notes (Signed)
Virtual Visit via Video Note   This visit type was conducted due to her co-morbid illnesses, this patient is at least at moderate risk for complications without adequate follow up.  Pt is home bound This format is felt to be most appropriate for this patient at this time.  All issues noted in this document were discussed and addressed.  A limited physical exam was performed with this format.  A verbal consent was obtained for the virtual visit.   Patient Location:home Provider Location:office Evaluation Performed:  Follow-Up Visit  Subjective:  Patient ID: Veronica [name], female    DOB: 06/05/1936  Age: 85 y.o. MRN: [id number]  Chief Complaint  Patient presents with   Discharged from Hospice care    HPI  Patient is an 85 yo WF with PMHx significant for congestive heart failure, stroke in 2004 approximately resulting in a Broca's aphasia and a rt hemiparesis, hypothyroidism, and vascular dementia.  Patient is homebound.  Her daughter is her main caretaker.  She is unable to bathe, dress, or go to the restroom.  She uses approximately 5 depends per day.  She is able to feed herself with a spoon.  She has been under hospice care for 1 to 2 years.  She no longer qualifies.  She was released from hospice on April 11, 2021.  She also will require a new hospital bed with pressure mattress, Geri chair, [employer] lift, hospital bed table. Pt requires nectar thick liquids to prevent aspiration. Pt is DNR.  Pt has chronic respiratory failure due to chf and requires oxygen 2 L at night and at times during the day.  Pt developed this after covid 19 in 2021. Pt has benefited from oxygen, duoneb treatments, and prednisone 10 mg daily.   Current Outpatient Medications on File Prior to Visit  Medication Sig Dispense Refill   acetaminophen (TYLENOL) 500 MG tablet Take 500 mg by mouth every 6 (six) hours as needed for mild pain or headache.     [facility] SENNA 8.6 MG tablet TAKE 2 TABLETS BY MOUTH IN THE AM AND  EVENING 56 tablet 2   HYDROcodone-acetaminophen (NORCO) 5-325 MG tablet Take 1 tablet by mouth every 4 (four) hours. 84 tablet 0   [facility] [facility]) OINT See admin instructions.     nystatin cream (MYCOSTATIN) APPLY TO AFFECTED AREA TWICE A DAY UNDER BREAST AND ABDOMINAL FOLD 30 g 3   No current facility-administered medications on file prior to visit.   Past Medical History:  Diagnosis Date   Atrophy of thyroid    CHF (congestive heart failure) ([place])    Depression    Hypothyroidism    Migraines    Stroke ([place]) 2004   Vascular dementia [facility])    Past Surgical History:  Procedure Laterality Date   ABDOMINAL HYSTERECTOMY      Family History  Problem Relation Age of Onset   Stroke Other    Hypertension Other    CAD Other    Cancer Other    Social History   Socioeconomic History   Marital status: Widowed    Spouse name: Not on file   Number of children: 3   Years of education: 60   Highest education level: 12th grade  Occupational History   Not on file  Tobacco Use   Smoking status: Never    Passive exposure: Never   Smokeless tobacco: Never  Vaping Use   Vaping Use: Never used  Substance and Sexual Activity  Alcohol use: Never   Drug use: Never   Sexual activity: Not Currently  Other Topics Concern   Not on file  Social History Narrative   Not on file   Social Determinants of Health   Financial Resource Strain: Low Risk    Difficulty of Paying Living Expenses: Not very hard  Food Insecurity: No Food Insecurity   Worried About Running Out of Food in the Last Year: Never true   Ran Out of Food in the Last Year: Never true  Transportation Needs: No Transportation Needs   Lack of Transportation (Medical): No   Lack of Transportation (Non-Medical): No  Physical Activity: Inactive   Days of Exercise per Week: 0 days   Minutes of Exercise per Session: 0 min  Stress: No Stress Concern Present   Feeling of Stress : Only a little  Social  Connections: Moderately Isolated   Frequency of Communication with Friends and Family: More than three times a week   Frequency of Social Gatherings with Friends and Family: More than three times a week   Attends Religious Services: 1 to 4 times per year   Active Member of [employer] or Organizations: No   Attends Banker Meetings: Never   Marital Status: Widowed    Review of Systems  Constitutional:  Negative for appetite change, fatigue and fever.  HENT:  Positive for ear pain (rt. pt is rubbing at it and indicating it hurts. this has happened about 4 times in the last couple of weeks.). Negative for congestion, sinus pressure and sore throat.   Eyes:  Negative for pain.  Respiratory:  Negative for cough, chest tightness, shortness of breath and wheezing.   Cardiovascular:  Negative for chest pain and palpitations.  Gastrointestinal:  Negative for abdominal pain, constipation, diarrhea, nausea and vomiting.  Genitourinary:  Negative for dysuria and hematuria.  Musculoskeletal:  Negative for arthralgias, back pain, joint swelling and myalgias.  Skin:  Negative for rash.  Neurological:  Negative for dizziness, weakness and headaches.  Psychiatric/Behavioral:  Negative for dysphoric mood. The patient is not nervous/anxious.     Objective:  BP 124/64 (BP Location: Left Arm, Patient Position: Sitting)   Pulse 96   Temp 98.6 F (37 C) (Temporal)   SpO2 92%   BP/Weight 04/25/2021 03/18/2020 03/17/2020  Systolic BP 124 145 -  Diastolic BP 64 55 -  Wt. (Lbs) - - 181.44  BMI - - 36.65    Physical Exam Vitals reviewed.  Constitutional:      Appearance: Normal appearance. She is normal weight.  Neurological:     Mental Status: She is alert.    Diabetic Foot Exam - Simple   No data filed      Lab Results  Component Value Date   WBC 14.6 (H) 03/08/2020   HGB 12.6 03/08/2020   HCT 37.9 03/08/2020   PLT 253 03/08/2020   GLUCOSE 89 03/18/2020   TRIG 98 03/02/2020    ALT 56 (H) 03/08/2020   AST 39 03/08/2020   NA 137 03/18/2020   K 4.8 03/18/2020   CL 100 03/18/2020   CREATININE 0.92 03/18/2020   BUN 35 (H) 03/18/2020   CO2 29 03/18/2020   TSH 1.550 08/12/2019      Assessment & Plan:   Problem List Items Addressed This Visit       Cardiovascular and Mediastinum   Chronic CHF (congestive heart failure) (HCC) - Primary    Continue lasix 20 mg once daily  Relevant Medications   furosemide (LASIX) 20 MG tablet   Other Relevant Orders   Ambulatory referral to [place]     Respiratory   Oropharyngeal dysphagia    Needs thickened liquids.      Relevant Orders   Ambulatory referral to [place]: Hypothyroidism    Continue synthroid 125 mcg once daily in am.      Relevant Medications   levothyroxine (SYNTHROID) 125 MCG tablet     Nervous and Auditory   Hemiplegia and hemiparesis following cerebral infarction affecting right dominant side (HCC) (Chronic)    Dependent on care from family.  Needs home health care.  Needs hoyer lift Needs Geriatric chair Needs Hospital bed with pressure reducing air mattress. Needs Hospital bed table.  Needs CNA care.        Relevant Orders   Ambulatory referral to [place]   Vascular dementia [facility])    Continue plavix       Relevant Medications   citalopram (CELEXA) 20 MG tablet   Other Relevant Orders   Ambulatory referral to [place]     Other   Broca's aphasia (Chronic)    Able to communicate with Daughter somewhat.      Relevant Orders   Ambulatory referral to Home Health   Functional urinary incontinence    Needs depends 5/day.      Relevant Orders   Ambulatory referral to [place]   Full incontinence of feces    Needs depends 5/day.      Relevant Orders   Ambulatory referral to [place] injury of skin   Relevant Orders   Ambulatory referral to [place]  .  Meds ordered this encounter  Medications    furosemide (LASIX) 20 MG tablet    Sig: Take 1 tablet (20 mg total) by mouth daily.    Dispense:  90 tablet    Refill:  1   clopidogrel (PLAVIX) 75 MG tablet    Sig: Take 1 tablet (75 mg total) by mouth daily.    Dispense:  90 tablet    Refill:  1   citalopram (CELEXA) 20 MG tablet    Sig: Take 1 tablet (20 mg total) by mouth daily.    Dispense:  90 tablet    Refill:  1   levothyroxine (SYNTHROID) 125 MCG tablet    Sig: Take 1 tablet (125 mcg total) by mouth daily.    Dispense:  90 tablet    Refill:  1   loratadine (CLARITIN) 10 MG tablet    Sig: TAKE 1 TABLET BY MOUTH EVERY DAY FOR 7 DAYS    Dispense:  90 tablet    Refill:  1   predniSONE (DELTASONE) 10 MG tablet    Sig: Take 1 tablet (10 mg total) by mouth daily with breakfast.    Dispense:  90 tablet    Refill:  1   ipratropium-albuterol (DUONEB) 0.5-2.5 (3) MG/3ML SOLN    Sig: INHALE 1 VIAL VIA NEBULIZER 4 TIMES A DAY    Dispense:  1080 mL    Refill:  3    90 day rx   amoxicillin (AMOXIL) 400 MG/5ML suspension    Sig: Take 6.3 mLs (500 mg total) by mouth 3 (three) times daily.    Dispense:  150 mL    Refill:  0    Orders Placed This Encounter  Procedures   Ambulatory referral to [place]  Total time spent on today's visit was greater than 40 minutes, including both face-to-face time and nonface-to-face time personally spent on review of chart (labs and imaging), discussing labs and goals, discussing further work-up, treatment options, referrals to specialist if needed, reviewing outside records of pertinent, answering patient's questions, and coordinating care.   Follow-up: Return in about 3 months (around 07/26/2021).  An After Visit Summary was printed and given to the patient.  I,Lauren M Auman,acting as a scribe for Rochel Brome, MD.,have documented all relevant documentation on the behalf of Rochel Brome, MD,as directed by  Rochel Brome, MD while in the presence of Rochel Brome, MD.    Rochel Brome, MD [place] [phone]

## 2021-04-25 NOTE — Assessment & Plan Note (Addendum)
Dependent on care from family.  Needs home health care.  Needs hoyer lift Needs Geriatric chair Needs Hospital bed with pressure reducing air mattress. Needs Hospital bed table.  Needs CNA care.

## 2021-05-01 ENCOUNTER — Telehealth: Payer: Self-pay

## 2021-05-01 NOTE — Telephone Encounter (Signed)
Veronica [name] called to report that her Mother's Home Health agency is not coverd by her insurance.  Dr. Sedalia Muta advised that she check with her insurance to determine which Home Health Agency is covered as well as DME company.  She will call and check and then call us back.

## 2021-05-10 ENCOUNTER — Other Ambulatory Visit: Payer: Self-pay | Admitting: Family Medicine

## 2021-05-10 ENCOUNTER — Telehealth: Payer: Self-pay | Admitting: *Deleted

## 2021-05-10 DIAGNOSIS — R4701 Aphasia: Secondary | ICD-10-CM

## 2021-05-10 DIAGNOSIS — F01B0 Vascular dementia, moderate, without behavioral disturbance, psychotic disturbance, mood disturbance, and anxiety: Secondary | ICD-10-CM

## 2021-05-10 DIAGNOSIS — I69351 Hemiplegia and hemiparesis following cerebral infarction affecting right dominant side: Secondary | ICD-10-CM

## 2021-05-10 NOTE — Chronic Care Management (AMB) (Addendum)
  Chronic Care Management   Note  05/10/2021 Name: BRIONNE MERTZ MRN: [id number] DOB: 1936-03-01  Veronica [name] is a 85 y.o. year old female who is a primary care patient of Cox, Kirsten, MD. I reached out to Tish Men by phone today in response to a referral sent by Ms. [place] PCP.  Ms. Zehring was given information about Chronic Care Management services today including:  CCM service includes personalized support from designated clinical staff supervised by her physician, including individualized plan of care and coordination with other care providers 24/7 contact phone numbers for assistance for urgent and routine care needs. Service will only be billed when office clinical staff spend 20 minutes or more in a month to coordinate care. Only one practitioner may furnish and bill the service in a calendar month. The patient may stop CCM services at any time (effective at the end of the month) by phone call to the office staff. The patient is responsible for co-pay (up to 20% after annual deductible is met) if co-pay is required by the individual health plan.   Daughter Burtis Junes DPR on file   verbally agreed to assistance and services provided by embedded care coordination/care management team today.  Follow up plan: Telephone appointment with care management team member scheduled for:05/17/21  [place] Management  Direct Dial: [phone]

## 2021-05-16 ENCOUNTER — Ambulatory Visit (INDEPENDENT_AMBULATORY_CARE_PROVIDER_SITE_OTHER): Payer: Medicare HMO | Admitting: *Deleted

## 2021-05-16 ENCOUNTER — Ambulatory Visit: Payer: Medicare HMO

## 2021-05-16 NOTE — Chronic Care Management (AMB) (Signed)
Chronic Care Management    Clinical Social Work Note  05/16/2021 Name: DESTENEE GUERRY MRN: [id number] DOB: 06-Mar-1936  SHILOH SOUTHERN is a 85 y.o. year old female who is a primary care patient of Cox, Kirsten, MD. The CCM team was consulted to assist the patient with chronic disease management and/or care coordination needs related to: Transportation Needs, [employer], Level of Care Concerns, Hospice/Palliative Care Services Education, Caregiver Stress, and Financial Difficulties.   Engaged with patient's daughter by telephone for initial visit in response to provider referral for social work chronic care management and care coordination services.   Consent to Services:  The patient was given information about Chronic Care Management services, agreed to services, and gave verbal consent prior to initiation of services.  Please see initial visit note for detailed documentation.   Patient agreed to services and consent obtained.   Assessment: Review of patient past medical history, allergies, medications, and health status, including review of relevant consultants reports was performed today as part of a comprehensive evaluation and provision of chronic care management and care coordination services.     SDOH (Social Determinants of Health) assessments and interventions performed:  SDOH Interventions    Flowsheet Row Most Recent Value  SDOH Interventions   Food Insecurity Interventions Intervention Not Indicated, Other (Comment)  [Verified by Daughter - McShall Craig]  Financial Strain Interventions Intervention Not Indicated, Other (Comment)  [Verified by Daughter - McShall Craig]  Housing Interventions Intervention Not Indicated, Other (Comment)  [Verified by Daughter - McShall Craig]  Intimate Partner Violence Interventions Intervention Not Indicated, Other (Comment)  [Verified by Daughter - McShall Craig]  Physical Activity Interventions Intervention Not Indicated, Other  (Comments)  [Verified by Daughter - McShall Craig]  Stress Interventions Intervention Not Indicated, Offered [employer], Other (Comment)  [Verified by Daughter - McShall Craig]  Social Connections Interventions Intervention Not Indicated, Other (Comment)  [Verified by Daughter - McShall Craig]  Transportation Interventions Intervention Not Indicated, Other (Comment)  [Verified by Daughter - McShall Craig]        Advanced Directives Status: See Care Plan for related entries.  CCM Care Plan  Allergies  Allergen Reactions   Cheese Other (See Comments)    headache   Chocolate Other (See Comments)    headache   Dextromethorphan    Lidoderm [Lidocaine]     "Made her crazy"    Outpatient Encounter Medications as of 05/16/2021  Medication Sig   acetaminophen (TYLENOL) 500 MG tablet Take 500 mg by mouth every 6 (six) hours as needed for mild pain or headache.   amoxicillin (AMOXIL) 400 MG/5ML suspension Take 6.3 mLs (500 mg total) by mouth 3 (three) times daily.   citalopram (CELEXA) 20 MG tablet Take 1 tablet (20 mg total) by mouth daily.   clopidogrel (PLAVIX) 75 MG tablet Take 1 tablet (75 mg total) by mouth daily.   [facility] SENNA 8.6 MG tablet TAKE 2 TABLETS BY MOUTH IN THE AM AND EVENING   furosemide (LASIX) 20 MG tablet Take 1 tablet (20 mg total) by mouth daily.   HYDROcodone-acetaminophen (NORCO) 5-325 MG tablet Take 1 tablet by mouth every 4 (four) hours.   Infant Care Products [facility]) OINT See admin instructions.   ipratropium-albuterol (DUONEB) 0.5-2.5 (3) MG/3ML SOLN INHALE 1 VIAL VIA NEBULIZER 4 TIMES A DAY   levothyroxine (SYNTHROID) 125 MCG tablet Take 1 tablet (125 mcg total) by mouth daily.   loratadine (CLARITIN) 10 MG tablet TAKE 1 TABLET BY MOUTH EVERY DAY FOR  7 DAYS   nystatin cream (MYCOSTATIN) APPLY TO AFFECTED AREA TWICE A DAY UNDER BREAST AND ABDOMINAL FOLD   predniSONE (DELTASONE) 10 MG tablet Take 1 tablet (10 mg total) by mouth daily with  breakfast.   No facility-administered encounter medications on file as of 05/16/2021.    Patient Active Problem List   Diagnosis Date Noted   Broca's aphasia 04/25/2021   Oropharyngeal dysphagia 04/25/2021   Functional urinary incontinence 04/25/2021   Full incontinence of feces 04/25/2021   Pressure injury of skin 03/04/2020   Chronic CHF (congestive heart failure) (HCC) 03/02/2020   Acute hypoxemic respiratory failure due to COVID-19 [facility]) 03/02/2020   Hypernatremia 03/02/2020   Vascular dementia (HCC)    Hemiplegia and hemiparesis following cerebral infarction affecting right dominant side (HCC) 04/26/2003    Conditions to be addressed/monitored: CHF and Dementia.  Corporate treasurer, Limited Social Support, Transportation, Level of Care Concerns, ADL/IADL Limitations, Family and Relationship Dysfunction, Social Isolation, Limited Access to Caregiver, Cognitive Deficits, Memory Deficits, and Lacks Knowledge of [employer].  Care Plan : LCSW Plan of Care  Updates made by Karolee Stamps, LCSW since 05/16/2021 12:00 AM     Problem: Maintain My Quality of Life through [facility].   Priority: High     Goal: Maintain My Quality of Life through [facility].   Start Date: 05/16/2021  Expected End Date: 08/14/2021  This Visit's Progress: On track  Recent Progress: On track  Priority: High  Note:   Current Barriers:   Patient with Moderate Vascular Dementia, Unspecified Whether Behavioral, Psychotic, Mood Disturbance or Anxiety, Broca's Aphasia, Hemiplegia and Hemiparesis Following Cerebral Infarction Affecting Right Dominant Side, Chronic Congestive Heart Failure, and Incontinence of Bowel and Bladder needs Support, Education, Resources, Referrals, Advocacy, and Care Coordination, to resolve unmet personal care needs in the home. Patient is unable to self-administer medications as prescribed or perform ADL's/IADL's independently. Lacks knowledge of  available community agencies and resources. Clinical Goals:  Patient will have Palliative Care Services, Hospice Care Services, Aid and Attendance Benefits Services and/or Psychiatric nurse Services in place.   Patient will attend all scheduled medical appointments as evidenced by patient's daughters' report and care team review of appointment completion in electronic medical record. Patient will demonstrate improved health management independence as evidenced by having in-home care services in place. Clinical Interventions: Daughter interviewed and appropriate assessments performed. Collaboration with Primary Care Physician, Dr. Blane Ohara regarding development and update of comprehensive plan of care as evidenced by provider attestation and co-signature. Inter-disciplinary care team collaboration (see longitudinal plan of care). Interventions performed:  Problem Solving/Task Centered, Psychoeducation/Health Education, Quality of Sleep Assessed and Sleep Hygiene Techniques Promoted, Caregiver Stress Acknowledged and Consideration of In-Home Care Services Encouraged. Provided daughter with the following list of resources and applications:  ~ Ship broker.  ~ [employer].  ~ Aid & Producer, television/film/video, Instructions, Application, Advice worker, and Affidavit in Support of Claim.  ~ Home Health Care Agencies.  ~ In-Home Care & Respite Agencies. Discussed plans with daughter for ongoing care management follow-up and provided direct contact information for care management team. Assisted daughter with obtaining information about health plan benefits through [facility]. Provided education to daughter regarding level of care options. Assessed needs, level of care concerns, basic eligibility and provided education on Aid and Attendance Benefits Application process, through [employer].   Identified resources and  durable medical equipment needed in the home to improve safety  and promote independence. Patient Goals/Self-Care Activities:  Review resource information and applications mailed to your home, and receive assistance with application completion and submission through LCSW and daughter. LCSW collaboration with Primary Care Physician, Dr. Blane Ohara to report your swollen left foot and ankle, since 04/25/2021. ~ Daughter encouraged to keep elevated as much as possible. ~ LCSW collaboration with Primary Care Physician, Dr. Blane Ohara to request referral for Hospice and Palliative Care Services, either through [facility] (# [phone]), [facility] (# [phone]), or [facility] [phone]).  ~ [facility] [phone]) have terminated services. ~ LCSW collaboration with Primary Care Physician, Dr. Blane Ohara to report daughter's concern about not being able to afford durable medical equipment (I.e. bedside table, hoyer lift, hospital bed, oxygen, breathing machine, and wheelchair), as well as bed pads, diapers, creams and medications, once services are officially terminated through [facility]. ~ Son-in-law, daughter's husband, is no longer willing to support daughter and patient financially. ~ Daughter will work with LCSW on a bi-weekly basis, in an effort to obtain services in the home, as well as durable medical equipment.   Follow-Up:  05/24/2021 at 4:00 pm  Danford Bad LCSW Licensed Clinical Social Worker [facility] [phone]

## 2021-05-16 NOTE — Progress Notes (Signed)
Erroneous Encounter

## 2021-05-16 NOTE — Patient Instructions (Signed)
Visit Information   Thank you for taking time to visit with me today. Please don't hesitate to contact me if I can be of assistance to you before our next scheduled telephone appointment.  Following are the goals we discussed today:   Patient Goals/Self-Care Activities:  Review resource information and applications mailed to your home, and receive assistance with application completion and submission through LCSW and daughter. LCSW collaboration with Primary Care Physician, Dr. Rochel Brome to report your swollen left foot and ankle, since 04/25/2021. ~ Daughter encouraged to keep elevated as much as possible. ~ LCSW collaboration with Primary Care Physician, Dr. Rochel Brome to request referral for Hospice and [place] [facility], either through [place] (# [phone]), [facility] (# [phone]), or [facility] [phone]).  ~ [facility] [phone]) have terminated services. ~ LCSW collaboration with Primary Care Physician, Dr. Rochel Brome to report daughter's concern about not being able to afford durable medical equipment (I.e. bedside table, hoyer lift, hospital bed, oxygen, breathing machine, and wheelchair), as well as bed pads, diapers, creams and medications, once services are officially terminated through [facility]. ~ Son-in-law, daughter's husband, is no longer willing to support daughter and patient financially. ~ Daughter will work with LCSW on a bi-weekly basis, in an effort to obtain services in the home, as well as durable medical equipment.    Our next appointment is by telephone on 05/24/2021 at 4:00 pm.  Please call the care guide team at [phone] if you need to cancel or reschedule your appointment.   If you are experiencing a Mental Health or [place] or need someone to talk to, please call the Suicide and Crisis Lifeline: 988 call the [place] National Suicide  Prevention Lifeline: 6178629539 or TTY: (986) 205-6074 TTY 850 656 4355) to talk to a trained counselor call 1-800-273-TALK (toll free, 24 hour hotline) go to [facility] [address], [place] [phone]) call the [facility]: [phone] call 911   Following is a copy of your full care plan:  Care Plan : LCSW Plan of Care  Updates made by Francis Gaines, LCSW since 05/16/2021 12:00 AM     Problem: Maintain My Quality of Life through [place].   Priority: High     Goal: Maintain My Quality of Life through [facility].   Start Date: 05/16/2021  Expected End Date: 08/14/2021  This Visit's Progress: On track  Recent Progress: On track  Priority: High  Note:   Current Barriers:   Patient with Moderate Vascular Dementia, Unspecified Whether Behavioral, Psychotic, Mood Disturbance or Anxiety, Broca's Aphasia, Hemiplegia and Hemiparesis Following Cerebral Infarction Affecting Right Dominant Side, Chronic Congestive Heart Failure, and Incontinence of Bowel and Bladder needs Support, Education, Resources, Referrals, Advocacy, and Care Coordination, to resolve unmet personal care needs in the home. Patient is unable to self-administer medications as prescribed or perform ADL's/IADL's independently. Lacks knowledge of available community agencies and resources. Clinical Goals:  Patient will have [place], Aid and Attendance Benefits Services and/or [place] in place.   Patient will attend all scheduled medical appointments as evidenced by patient's daughters' report and care team review of appointment completion in electronic medical record. Patient will demonstrate improved health management independence as evidenced by having in-home care services in place. Clinical Interventions: Daughter interviewed and appropriate assessments  performed. Collaboration with Primary Care Physician, Dr.  Kirsten Cox regarding development and update of comprehensive plan of care as evidenced by provider attestation and co-signature. Inter-disciplinary care team collaboration (see longitudinal plan of care). Interventions performed:  Problem Solving/Task Centered, Psychoeducation/Health Education, Quality of Sleep Assessed and Sleep Hygiene Techniques Promoted, Caregiver Stress Acknowledged and Consideration of In-Home Care Services Encouraged. Provided daughter with the following list of resources and applications:  ~ Furniture conservator/restorer.  ~ [employer].  ~ Aid & Lexicographer, Instructions, Application, Hydrographic surveyor, and Affidavit in Support of Claim.  ~ [place].  ~ [employer]. Discussed plans with daughter for ongoing care management follow-up and provided direct contact information for care management team. Assisted daughter with obtaining information about health plan benefits through [facility]. Provided education to daughter regarding level of care options. Assessed needs, level of care concerns, basic eligibility and provided education on Aid and Attendance Benefits Application process, through [employer].   Identified resources and durable medical equipment needed in the home to improve safety and promote independence. Patient Goals/Self-Care Activities:  Review resource information and applications mailed to your home, and receive assistance with application completion and submission through LCSW and daughter. LCSW collaboration with Primary Care Physician, Dr. Rochel Brome to report your swollen left foot and ankle, since 04/25/2021. ~ Daughter encouraged to keep elevated as much as possible. ~ LCSW collaboration with Primary Care Physician, Dr. Rochel Brome to request referral for Hospice and [place], either through [place] (# [phone]), [facility] (# [phone]), or [facility] [phone]).  ~ [facility] [phone]) have terminated services. ~ LCSW collaboration with Primary Care Physician, Dr. Rochel Brome to report daughter's concern about not being able to afford durable medical equipment (I.e. bedside table, hoyer lift, hospital bed, oxygen, breathing machine, and wheelchair), as well as bed pads, diapers, creams and medications, once services are officially terminated through [facility]. ~ Son-in-law, daughter's husband, is no longer willing to support daughter and patient financially. ~ Daughter will work with LCSW on a bi-weekly basis, in an effort to obtain services in the home, as well as durable medical equipment.   Follow-Up:  05/24/2021 at 4:00 pm         Consent to CCM Services: Ms. Purington was given information about Chronic Care Management services including:  CCM service includes personalized support from designated clinical staff supervised by her physician, including individualized plan of care and coordination with other care providers 24/7 contact phone numbers for assistance for urgent and routine care needs. Service will only be billed when office clinical staff spend 20 minutes or more in a month to coordinate care. Only one practitioner may furnish and bill the service in a calendar month. The patient may stop CCM services at any time (effective at the end of the month) by phone call to the office staff. The patient will be responsible for cost sharing (co-pay) of up to 20% of the service fee (after annual deductible is met).  Patient agreed to services and verbal consent obtained.   Patient verbalizes understanding of instructions provided today and agrees to view in [place].   Telephone follow up appointment with care management team member scheduled  for:  05/24/2021 at 4:00 pm.  Nat Christen LCSW Licensed Clinical Social Worker [place] [phone]

## 2021-05-17 ENCOUNTER — Encounter: Payer: Self-pay | Admitting: *Deleted

## 2021-05-17 ENCOUNTER — Telehealth: Payer: Medicare HMO

## 2021-05-24 ENCOUNTER — Other Ambulatory Visit: Payer: Self-pay | Admitting: Family Medicine

## 2021-05-24 ENCOUNTER — Telehealth: Payer: Medicare HMO | Admitting: *Deleted

## 2021-05-24 ENCOUNTER — Ambulatory Visit: Payer: Medicare HMO | Admitting: *Deleted

## 2021-05-24 DIAGNOSIS — I69351 Hemiplegia and hemiparesis following cerebral infarction affecting right dominant side: Secondary | ICD-10-CM

## 2021-05-24 DIAGNOSIS — I509 Heart failure, unspecified: Secondary | ICD-10-CM

## 2021-05-24 DIAGNOSIS — R4701 Aphasia: Secondary | ICD-10-CM

## 2021-05-24 DIAGNOSIS — E870 Hyperosmolality and hypernatremia: Secondary | ICD-10-CM

## 2021-05-24 DIAGNOSIS — F01C0 Vascular dementia, severe, without behavioral disturbance, psychotic disturbance, mood disturbance, and anxiety: Secondary | ICD-10-CM

## 2021-05-24 DIAGNOSIS — F0150 Vascular dementia without behavioral disturbance: Secondary | ICD-10-CM

## 2021-05-24 DIAGNOSIS — R1312 Dysphagia, oropharyngeal phase: Secondary | ICD-10-CM

## 2021-05-24 NOTE — Chronic Care Management (AMB) (Signed)
Chronic Care Management    Clinical Social Work Note  05/24/2021 Name: Veronica [name] MRN: [id number] DOB: 1936-02-09  Veronica [name] is a 85 y.o. year old female who is a primary care patient of Cox, Kirsten, MD. The CCM team was consulted to assist the patient with chronic disease management and/or care coordination needs related to: [employer], Level of Care Concerns, Hospice/Palliative Care Services Education, and Caregiver Stress.   Engaged with patient's daugher by telephone for follow up visit in response to provider referral for social work chronic care management and care coordination services.   Consent to Services:  The patient was given information about Chronic Care Management services, agreed to services, and gave verbal consent prior to initiation of services.  Please see initial visit note for detailed documentation.   Patient agreed to services and consent obtained.   Assessment: Review of patient past medical history, allergies, medications, and health status, including review of relevant consultants reports was performed today as part of a comprehensive evaluation and provision of chronic care management and care coordination services.     SDOH (Social Determinants of Health) assessments and interventions performed:    Advanced Directives Status: Not addressed in this encounter.  CCM Care Plan  Allergies  Allergen Reactions   Cheese Other (See Comments)    headache   Chocolate Other (See Comments)    headache   Dextromethorphan    Lidoderm [Lidocaine]     "Made her crazy"    Outpatient Encounter Medications as of 05/24/2021  Medication Sig   acetaminophen (TYLENOL) 500 MG tablet Take 500 mg by mouth every 6 (six) hours as needed for mild pain or headache.   amoxicillin (AMOXIL) 400 MG/5ML suspension Take 6.3 mLs (500 mg total) by mouth 3 (three) times daily.   citalopram (CELEXA) 20 MG tablet Take 1 tablet (20 mg total) by mouth daily.    clopidogrel (PLAVIX) 75 MG tablet Take 1 tablet (75 mg total) by mouth daily.   [employer] SENNA 8.6 MG tablet TAKE 2 TABLETS BY MOUTH IN THE AM AND EVENING   furosemide (LASIX) 20 MG tablet Take 1 tablet (20 mg total) by mouth daily.   HYDROcodone-acetaminophen (NORCO) 5-325 MG tablet Take 1 tablet by mouth every 4 (four) hours.   Infant Care Products [facility]) OINT See admin instructions.   ipratropium-albuterol (DUONEB) 0.5-2.5 (3) MG/3ML SOLN INHALE 1 VIAL VIA NEBULIZER 4 TIMES A DAY   levothyroxine (SYNTHROID) 125 MCG tablet Take 1 tablet (125 mcg total) by mouth daily.   loratadine (CLARITIN) 10 MG tablet TAKE 1 TABLET BY MOUTH EVERY DAY FOR 7 DAYS   nystatin cream (MYCOSTATIN) APPLY TO AFFECTED AREA TWICE A DAY UNDER BREAST AND ABDOMINAL FOLD   predniSONE (DELTASONE) 10 MG tablet Take 1 tablet (10 mg total) by mouth daily with breakfast.   No facility-administered encounter medications on file as of 05/24/2021.    Patient Active Problem List   Diagnosis Date Noted   Broca's aphasia 04/25/2021   Oropharyngeal dysphagia 04/25/2021   Functional urinary incontinence 04/25/2021   Full incontinence of feces 04/25/2021   Pressure injury of skin 03/04/2020   Chronic CHF (congestive heart failure) (HCC) 03/02/2020   Acute hypoxemic respiratory failure due to COVID-19 [facility]) 03/02/2020   Hypernatremia 03/02/2020   Vascular dementia (HCC)    Hemiplegia and hemiparesis following cerebral infarction affecting right dominant side (HCC) 04/26/2003    Conditions to be addressed/monitored: Anxiety and Dementia.  Limited Social Support, Level of Care Concerns, ADL/IADL  Limitations, Social Isolation, Limited Access to Caregiver, Cognitive Deficits, Memory Deficits, and Lacks Knowledge of [employer].  Care Plan : LCSW Plan of Care  Updates made by Karolee Stamps, LCSW since 05/24/2021 12:00 AM     Problem: Maintain My Quality of Life through [facility].   Priority: High      Goal: Maintain My Quality of Life through [facility].   Start Date: 05/16/2021  Expected End Date: 08/14/2021  This Visit's Progress: On track  Recent Progress: On track  Priority: High  Note:   Current Barriers:   Patient with Moderate Vascular Dementia, Unspecified Whether Behavioral, Psychotic, Mood Disturbance or Anxiety, Broca's Aphasia, Hemiplegia and Hemiparesis Following Cerebral Infarction Affecting Right Dominant Side, Chronic Congestive Heart Failure, and Incontinence of Bowel and Bladder needs Support, Education, Resources, Referrals, Advocacy, and Care Coordination, to resolve unmet personal care needs in the home. Patient is unable to self-administer medications as prescribed or perform ADL's/IADL's independently. Lacks knowledge of available community agencies and resources. Clinical Goals:  Patient will have Palliative Care Services, Hospice Care Services, Aid and Attendance Benefits Services and/or Psychiatric nurse Services in place.   Patient will attend all scheduled medical appointments as evidenced by patient's daughters' report and care team review of appointment completion in electronic medical record. Patient will demonstrate improved health management independence as evidenced by having in-home care services in place. Clinical Interventions: Collaboration with Primary Care Physician, Dr. Blane Ohara regarding development and update of comprehensive plan of care as evidenced by provider attestation and co-signature. Inter-disciplinary care team collaboration (see longitudinal plan of care). Interventions performed:  Problem Solving/Task Centered, Psychoeducation/Health Education, Quality of Sleep Assessed and Sleep Hygiene Techniques Promoted, Caregiver Stress Acknowledged and Consideration of In-Home Care Services Encouraged. Patient Goals/Self-Care Activities:  Review of resource information and applications with daughter, and assistance provided with  application completion.    LCSW collaboration with Primary Care Physician, Dr. Blane Ohara to request referral for Hospice and Palliative Care Services. ~  Referral placed to [facility] (# [phone]) on 05/24/2021. LCSW collaboration with representative from [facility] to confirm receipt of referral order. Daughter will work with LCSW on a bi-weekly basis, in an effort to obtain services in the home, as well as durable medical equipment.   Daughter will contact LCSW directly (# [phone]) if she has questions, needs assistance, or if additional social work needs are identified between now and our next scheduled telephone outreach call. Follow-Up:  06/23/2021 at 2:00 pm       Danford Bad LCSW Licensed Clinical Social Worker [facility] [phone]

## 2021-05-24 NOTE — Patient Instructions (Signed)
Visit Information  Thank you for taking time to visit with me today. Please don't hesitate to contact me if I can be of assistance to you before our next scheduled telephone appointment.  Following are the goals we discussed today:   Patient Goals/Self-Care Activities:  Review of resource information and applications with daughter, and assistance provided with application completion.    LCSW collaboration with Primary Care Physician, Dr. Blane Ohara to request referral for Hospice and Palliative Care Services. ~  Referral placed to [facility] (# [phone]) on 05/24/2021. LCSW collaboration with representative from [facility] to confirm receipt of referral order. Daughter will work with LCSW on a bi-weekly basis, in an effort to obtain services in the home, as well as durable medical equipment.   Daughter will contact LCSW directly (# [phone]) if she has questions, needs assistance, or if additional social work needs are identified between now and our next scheduled telephone outreach call.  Follow-Up:  06/23/2021 at 2:00 pm  Please call the care guide team at [phone] if you need to cancel or reschedule your appointment.   If you are experiencing a Mental Health or Behavioral Health Crisis or need someone to talk to, please call the Suicide and Crisis Lifeline: 988 call the [place] National Suicide Prevention Lifeline: (601)866-9185 or TTY: (510)733-0989 TTY (401)146-2860) to talk to a trained counselor call 1-800-273-TALK (toll free, 24 hour hotline) go to [facility] [address], [place] [phone]) call the [facility] Crisis Line: (670)740-5367 call 911   Patient verbalizes understanding of instructions provided today and agrees to view in MyChart.   Danford Bad LCSW Licensed Clinical Social Worker [facility] [phone]

## 2021-06-01 ENCOUNTER — Telehealth: Payer: Self-pay

## 2021-06-01 NOTE — Telephone Encounter (Signed)
Drenda Freeze with Hospice left VM stating she did evaluation on pt. Pt was not approved for hospice however they are going to admit her to the home palliative care program. When she is ready for hospice it will be easier transition.   If this is not ok asked for call back to [phone]. Do not have to call back if agree.   [name] 06/01/21 7:49 AM

## 2021-06-08 ENCOUNTER — Ambulatory Visit: Payer: Self-pay | Admitting: *Deleted

## 2021-06-08 DIAGNOSIS — F0150 Vascular dementia without behavioral disturbance: Secondary | ICD-10-CM

## 2021-06-08 DIAGNOSIS — I509 Heart failure, unspecified: Secondary | ICD-10-CM

## 2021-06-08 NOTE — Patient Instructions (Signed)
Visit Information  Thank you for taking time to visit with me today. Please don't hesitate to contact me if I can be of assistance to you before our next scheduled telephone appointment.  Following are the goals we discussed today:  Check with [facility] and [employer] for coverage/eligibility of in-home care   Our next appointment is by telephone on 06/23/21    Please call the care guide team at [phone] if you need to cancel or reschedule your appointment.   If you are experiencing a Mental Health or Behavioral Health Crisis or need someone to talk to, please call the Suicide and Crisis Lifeline: 988 call 911   The patient verbalized understanding of instructions, educational materials, and care plan provided today and declined offer to receive copy of patient instructions, educational materials, and care plan.  Reece Levy, MSW, LCSW

## 2021-06-08 NOTE — Chronic Care Management (AMB) (Addendum)
This CSW received notice about pt's family with concerns related to CNA care. CSW contacted [facility] and was initially told by Jill Side that the family "was not ready for Hospice" but was agreeable to their Palliative team- after conversation with pt's family and follow up call to Hospice, it was clarified that she is actually not eligible for Hospice at this time- previously had been eligible and under their care.   CSW discussed with family possible options to consider with pursuing CNA care in the home and they will consider however financially do not feel they can pay out of pocket for this care. CSW provided family with info on the Veteran's program to see if that may be an avenue if eligible (sounds unlikely) as well as to check with pt's insurance [employer]).   Per [facility], pt will receive RN visits from the Palliative team every other week at this time and no other disciplines under this program.   CSW will alert assigned CSW to the above upon her return- next outreach visit/call scheduled for 06/23/21.   Reece Levy, MSW, LCSW

## 2021-06-10 DIAGNOSIS — I509 Heart failure, unspecified: Secondary | ICD-10-CM

## 2021-06-10 DIAGNOSIS — F01B0 Vascular dementia, moderate, without behavioral disturbance, psychotic disturbance, mood disturbance, and anxiety: Secondary | ICD-10-CM

## 2021-06-10 DIAGNOSIS — E039 Hypothyroidism, unspecified: Secondary | ICD-10-CM

## 2021-06-10 DIAGNOSIS — F0150 Vascular dementia without behavioral disturbance: Secondary | ICD-10-CM

## 2021-06-10 DIAGNOSIS — F01C0 Vascular dementia, severe, without behavioral disturbance, psychotic disturbance, mood disturbance, and anxiety: Secondary | ICD-10-CM

## 2021-06-22 ENCOUNTER — Telehealth: Payer: Self-pay

## 2021-06-22 NOTE — Telephone Encounter (Signed)
Veronica [name], nurse w/ [facility] calling after today's visit. Pt has had cough x1 wk and congestion that started today. Stated she normally gets a sinus infection around this time of year but daughter was flu positive last week. Has not tried anything OTC per nurse. Also on examination pt has bilateral edema in feet/ankles, 2-3+.   Lorita Officer, [place] 06/22/21 3:04 PM

## 2021-06-23 ENCOUNTER — Ambulatory Visit (INDEPENDENT_AMBULATORY_CARE_PROVIDER_SITE_OTHER): Payer: Medicare HMO | Admitting: *Deleted

## 2021-06-23 ENCOUNTER — Other Ambulatory Visit: Payer: Self-pay | Admitting: Family Medicine

## 2021-06-23 DIAGNOSIS — R4701 Aphasia: Secondary | ICD-10-CM

## 2021-06-23 DIAGNOSIS — F0150 Vascular dementia without behavioral disturbance: Secondary | ICD-10-CM

## 2021-06-23 DIAGNOSIS — R3981 Functional urinary incontinence: Secondary | ICD-10-CM

## 2021-06-23 DIAGNOSIS — R1312 Dysphagia, oropharyngeal phase: Secondary | ICD-10-CM

## 2021-06-23 DIAGNOSIS — F01B0 Vascular dementia, moderate, without behavioral disturbance, psychotic disturbance, mood disturbance, and anxiety: Secondary | ICD-10-CM

## 2021-06-23 DIAGNOSIS — I509 Heart failure, unspecified: Secondary | ICD-10-CM

## 2021-06-23 DIAGNOSIS — F01C0 Vascular dementia, severe, without behavioral disturbance, psychotic disturbance, mood disturbance, and anxiety: Secondary | ICD-10-CM

## 2021-06-23 MED ORDER — AMOXICILLIN 500 MG PO CAPS: 500.0000 mg | ORAL_CAPSULE | Freq: Three times a day (TID) | ORAL | 0 refills | Status: AC

## 2021-06-23 MED ORDER — AMOXICILLIN 400 MG/5ML PO SUSR: 500.0000 mg | Freq: Three times a day (TID) | ORAL | 0 refills | Status: DC

## 2021-06-23 NOTE — Patient Instructions (Signed)
Visit Information  Thank you for taking time to visit with me today. Please don't hesitate to contact me if I can be of assistance to you before our next scheduled telephone appointment.  Following are the goals we discussed today:  Patient Goals/Self-Care Activities:  LCSW collaboration with daughter to confirm establishment of Skilled Nursing care, every two weeks, and Social Work services, once per month, both through [facility] [phone]# [phone]) with [facility] [phone]), as of 06/01/2021. LCSW collaboration with Artelia Laroche, Embedded Pharmacist at [facility] [phone]), to place order for assistance with medication management, monitoring and financial assistance.   LCSW collaboration with [employer] Guides, to place order for financial assistance and resources in obtaining 2XL Pullups, Bed Pads, and Baby Wipes, preferably [employer]) - Fragrance Free.    LCSW collaboration with Reece Levy, Newly Embedded LCSW at [facility], to report findings of conversation with daughter on 06/23/2021, as well as to request further social work involvement for caregiver stress support.  Daughter will contact LCSW directly if she has questions, needs assistance, or if additional social work needs are identified between now and the next scheduled telephone outreach call. Follow-Up:  Scheduling Care Guides will outreach to patient's daughter to schedule follow-up outreach call with LCSW.    Please call the care guide team at [phone] if you need to cancel or reschedule your appointment.   If you are experiencing a Mental Health or Behavioral Health Crisis or need someone to talk to, please call the Suicide and Crisis Lifeline: 988 call the [place] National Suicide Prevention Lifeline: 269-131-8996 or TTY: 762-249-8882 TTY 239-176-7363) to talk to a trained counselor call 1-800-273-TALK (toll free, 24  hour hotline) go to [facility] Urgent Care [address] [place] [phone]) call the [facility] Crisis Line: [phone] call 911   Patient verbalizes understanding of instructions and care plan provided today and agrees to view in MyChart. Active MyChart status confirmed with patient.    Danford Bad LCSW Licensed Clinical Social Worker [facility] [phone]

## 2021-06-23 NOTE — Telephone Encounter (Signed)
Patient's daughter informed. [place] 

## 2021-06-23 NOTE — Chronic Care Management (AMB) (Signed)
Chronic Care Management    Clinical Social Work Note  06/23/2021 Name: Veronica [name] MRN: [id number] DOB: May 27, 1936  Veronica [name] is a 86 y.o. year old female who is a primary care patient of Cox, Kirsten, MD. The CCM team was consulted to assist the patient with chronic disease management and/or care coordination needs related to: [employer], [employer], and Caregiver Stress.   Engaged with patient's daughter by telephone for follow up visit in response to provider referral for social work chronic care management and care coordination services.   Consent to Services:  The patient was given information about Chronic Care Management services, agreed to services, and gave verbal consent prior to initiation of services.  Please see initial visit note for detailed documentation.   Patient agreed to services and consent obtained.   Assessment: Review of patient past medical history, allergies, medications, and health status, including review of relevant consultants reports was performed today as part of a comprehensive evaluation and provision of chronic care management and care coordination services.     SDOH (Social Determinants of Health) assessments and interventions performed:    Advanced Directives Status: Not addressed in this encounter.  CCM Care Plan  Allergies  Allergen Reactions   Cheese Other (See Comments)    headache   Chocolate Other (See Comments)    headache   Dextromethorphan    Lidoderm [Lidocaine]     "Made her crazy"    Outpatient Encounter Medications as of 06/23/2021  Medication Sig   acetaminophen (TYLENOL) 500 MG tablet Take 500 mg by mouth every 6 (six) hours as needed for mild pain or headache.   amoxicillin (AMOXIL) 400 MG/5ML suspension Take 6.3 mLs (500 mg total) by mouth 3 (three) times daily.   amoxicillin (AMOXIL) 500 MG capsule Take 1 capsule (500 mg total) by mouth 3 (three) times daily for 10 days.    citalopram (CELEXA) 20 MG tablet Take 1 tablet (20 mg total) by mouth daily.   clopidogrel (PLAVIX) 75 MG tablet Take 1 tablet (75 mg total) by mouth daily.   [employer] SENNA 8.6 MG tablet TAKE 2 TABLETS BY MOUTH IN THE AM AND EVENING   furosemide (LASIX) 20 MG tablet Take 1 tablet (20 mg total) by mouth daily.   HYDROcodone-acetaminophen (NORCO) 5-325 MG tablet Take 1 tablet by mouth every 4 (four) hours.   Infant Care Products [facility]) OINT See admin instructions.   ipratropium-albuterol (DUONEB) 0.5-2.5 (3) MG/3ML SOLN INHALE 1 VIAL VIA NEBULIZER 4 TIMES A DAY   levothyroxine (SYNTHROID) 125 MCG tablet Take 1 tablet (125 mcg total) by mouth daily.   loratadine (CLARITIN) 10 MG tablet TAKE 1 TABLET BY MOUTH EVERY DAY FOR 7 DAYS   nystatin cream (MYCOSTATIN) APPLY TO AFFECTED AREA TWICE A DAY UNDER BREAST AND ABDOMINAL FOLD   predniSONE (DELTASONE) 10 MG tablet Take 1 tablet (10 mg total) by mouth daily with breakfast.   [DISCONTINUED] amoxicillin (AMOXIL) 400 MG/5ML suspension Take 6.3 mLs (500 mg total) by mouth 3 (three) times daily.   No facility-administered encounter medications on file as of 06/23/2021.    Patient Active Problem List   Diagnosis Date Noted   Broca's aphasia 04/25/2021   Oropharyngeal dysphagia 04/25/2021   Functional urinary incontinence 04/25/2021   Full incontinence of feces 04/25/2021   Pressure injury of skin 03/04/2020   Chronic CHF (congestive heart failure) (HCC) 03/02/2020   Acute hypoxemic respiratory failure due to COVID-19 [facility]) 03/02/2020   Hypernatremia 03/02/2020  Vascular dementia (HCC)    Hemiplegia and hemiparesis following cerebral infarction affecting right dominant side (HCC) 04/26/2003    Conditions to be addressed/monitored: HTN and Dementia.  Financial Constraints, Level of Care Concerns, ADL/IADL Limitations, Limited Access to Caregiver, Cognitive Deficits, Memory Deficits, and Lacks Knowledge of [employer].  Care Plan : LCSW  Plan of Care  Updates made by Karolee Stamps, LCSW since 06/23/2021 12:00 AM     Problem: Maintain My Quality of Life through [employer].   Priority: High     Goal: Maintain My Quality of Life through [facility].   Start Date: 05/16/2021  Expected End Date: 08/14/2021  This Visit's Progress: On track  Recent Progress: On track  Priority: High  Note:   Current Barriers:   Patient with Moderate Vascular Dementia, Unspecified Whether Behavioral, Psychotic, Mood Disturbance or Anxiety, Broca's Aphasia, Hemiplegia and Hemiparesis Following Cerebral Infarction Affecting Right Dominant Side, Chronic Congestive Heart Failure, and Incontinence of Bowel and Bladder needs Support, Education, Resources, Referrals, Advocacy, and Care Coordination, to resolve unmet personal care needs in the home. Patient is unable to self-administer medications as prescribed or perform ADL's/IADL's independently. Lacks knowledge of available community agencies and resources. Clinical Goals:  Patient will have Palliative Care Services, Hospice Care Services, Aid and Attendance Benefits Services and/or Psychiatric nurse Services in place.   Patient will attend all scheduled medical appointments as evidenced by patient's daughters' report and care team review of appointment completion in electronic medical record. Patient will demonstrate improved health management independence as evidenced by having in-home care services in place. Clinical Interventions: Collaboration with Primary Care Physician, Dr. Blane Ohara regarding development and update of comprehensive plan of care as evidenced by provider attestation and co-signature. Inter-disciplinary care team collaboration (see longitudinal plan of care). Interventions performed:  Problem Solving/Task Centered, Psychoeducation/Health Education, Quality of Sleep Assessed and Sleep Hygiene Techniques Promoted, Caregiver Stress Acknowledged and  Consideration of In-Home Care Services Encouraged. Patient Goals/Self-Care Activities:  LCSW collaboration with daughter to confirm establishment of Skilled Nursing care, every two weeks, and Social Work services, once per month, both through [facility] [phone]# [phone]) with [facility] [phone]), as of 06/01/2021. LCSW collaboration with Artelia Laroche, Embedded Pharmacist at [facility] [phone]), to place order for assistance with medication management, monitoring and financial assistance.   LCSW collaboration with [employer], to place order for financial assistance and resources in obtaining 2XL Pullups, Bed Pads, and Baby Wipes, preferably [employer] ([employer]) - Fragrance Free.    LCSW collaboration with Reece Levy, Newly Embedded LCSW at [facility], to report findings of conversation with daughter on 06/23/2021, as well as to request further social work involvement for caregiver stress support.  Daughter will contact LCSW directly if she has questions, needs assistance, or if additional social work needs are identified between now and the next scheduled telephone outreach call. Follow-Up:  Scheduling Care [facility]ides will outreach to patient's daughter to schedule follow-up outreach call with LCSW.         Danford Bad LCSW Licensed Clinical Social Worker [facility] [phone]

## 2021-06-26 ENCOUNTER — Telehealth: Payer: Self-pay | Admitting: Family Medicine

## 2021-06-26 NOTE — Chronic Care Management (AMB) (Signed)
°  Chronic Care Management   Note  06/26/2021 Name: Veronica [name] MRN: [id number] DOB: 02-Jan-1936  Veronica [name] is a 86 y.o. year old female who is a primary care patient of Cox, Kirsten, MD. I reached out to Tish Men by phone today in response to a referral sent by Ms. Gilles Chiquito Buis's PCP, Cox, Kirsten, MD.   [name] was given information about Chronic Care Management services today including:  CCM service includes personalized support from designated clinical staff supervised by her physician, including individualized plan of care and coordination with other care providers 24/7 contact phone numbers for assistance for urgent and routine care needs. Service will only be billed when office clinical staff spend 20 minutes or more in a month to coordinate care. Only one practitioner may furnish and bill the service in a calendar month. The patient may stop CCM services at any time (effective at the end of the month) by phone call to the office staff.   CRAIG  MCSHALL/DAUGHTER verbally agreed to assistance and services provided by embedded care coordination/care management team today.  Follow up plan: PT [place]

## 2021-06-28 ENCOUNTER — Telehealth: Payer: Self-pay

## 2021-06-28 NOTE — Chronic Care Management (AMB) (Signed)
Chronic Care Management Pharmacy Assistant   Name: Veronica [name]  MRN: [id number] DOB: [birth year]   Reason for Encounter: Chart Prep for initial visit with CPP    Conditions to be addressed/monitored: CHF, Vascular Dementia, Oropharyngeal Dysphagia, Broca's Aphasia, Hemiplegia and Hemiparesis, Incontinence  Recent office visits:  06/23/21 Rochel Brome MD. Orders Only. Reordered Amoxicillin 400mg /35ml suspension and Amoxicillin 500 mg for 10 days.   06/22/21 Rochel Brome MD. Telephone Encounter. Instructed to increase lasix to 40 mg daily and add Potassium Chloride.  04/25/21 Rochel Brome MD. Seen for CHF. Started on Amoxicillin 500 mg 3 times daily. Decreased Furosemide from 20 mg 2 times daily to 20 mg daily. D/C Losartan Potassium 50 mg and Albuterol Inhaler. Referral to Home Health.   Recent consult visits:  None in the last 6 months  Hospital visits:  None in previous 6 months  Medications: Outpatient Encounter Medications as of 06/28/2021  Medication Sig   acetaminophen (TYLENOL) 500 MG tablet Take 500 mg by mouth every 6 (six) hours as needed for mild pain or headache.   amoxicillin (AMOXIL) 400 MG/5ML suspension Take 6.3 mLs (500 mg total) by mouth 3 (three) times daily.   amoxicillin (AMOXIL) 500 MG capsule Take 1 capsule (500 mg total) by mouth 3 (three) times daily for 10 days.   citalopram (CELEXA) 20 MG tablet Take 1 tablet (20 mg total) by mouth daily.   clopidogrel (PLAVIX) 75 MG tablet Take 1 tablet (75 mg total) by mouth daily.   [employer] SENNA 8.6 MG tablet TAKE 2 TABLETS BY MOUTH IN THE AM AND EVENING   furosemide (LASIX) 20 MG tablet Take 1 tablet (20 mg total) by mouth daily.   HYDROcodone-acetaminophen (NORCO) 5-325 MG tablet Take 1 tablet by mouth every 4 (four) hours.   Infant Care Products [facility]) OINT See admin instructions.   ipratropium-albuterol (DUONEB) 0.5-2.5 (3) MG/3ML SOLN INHALE 1 VIAL VIA NEBULIZER 4 TIMES A DAY   levothyroxine (SYNTHROID)  125 MCG tablet Take 1 tablet (125 mcg total) by mouth daily.   loratadine (CLARITIN) 10 MG tablet TAKE 1 TABLET BY MOUTH EVERY DAY FOR 7 DAYS   nystatin cream (MYCOSTATIN) APPLY TO AFFECTED AREA TWICE A DAY UNDER BREAST AND ABDOMINAL FOLD   predniSONE (DELTASONE) 10 MG tablet Take 1 tablet (10 mg total) by mouth daily with breakfast.   No facility-administered encounter medications on file as of 06/28/2021.    No results found for: HGBA1C, MICROALBUR   BP Readings from Last 3 Encounters:  04/26/21 124/64  03/18/20 (!) 145/55  10/08/19 (!) 116/54    Patient Questions:   Have you seen any other providers since your last visit with PCP? Yes, the nurse that comes out for her palliative care   Any changes in your medications or health? Yes, daughter stated she is having a lot of swelling in her feet and legs. Daughter stated she is having a hard time breathing sometimes but is on an antibiotic to help with increased flem.   Any side effects from any medications? Daughter denies any side effects   Do you have an symptoms or problems not managed by your medications? No  Any concerns about your health right now? Pt daughter stated her mom is on an antibiotic for flem pt was coughing up and she feels better now that she is on that. Other than her breathing, daughter has no concerns   Has your provider asked that you check blood pressure, blood sugar, or follow special  diet at home? Yes, pt is on all puree foods and no salt.   Do you get any type of exercise on a regular basis? No, pt is bed bound   Do you have any problems getting your medications? No  Is there anything that you would like to discuss during the appointment? Yes, daughter wants to talk about options on her medication cost and care cost that isn't covered under insurance. Daughter wanted to see if we can apply for PAP for some of her meds. I briefly informed her of our process and told her we can definitely see what we can  apply for. She is having a hard time paying for some creams and other meds.    Tish Men was reminded to have all medications, supplements and any blood glucose and blood pressure readings available for review with [place] Constable Pharm. D, at her telephone visit on 07/03/21 at 2:00 pm .    Star Rating Drugs:  Medication:  Last Fill: Day Supply None noted    Care Gaps: Last annual wellness visit? None noted  Mammogram: None noted  Dexa Scan: Never done  Last eye exam / retinopathy screening?N/A Last diabetic foot exam?N/A   Elray Mcgregor, [place] Pharmacist Assistant  [phone]

## 2021-07-03 ENCOUNTER — Other Ambulatory Visit: Payer: Self-pay

## 2021-07-03 ENCOUNTER — Ambulatory Visit: Payer: Medicare HMO

## 2021-07-03 DIAGNOSIS — E039 Hypothyroidism, unspecified: Secondary | ICD-10-CM

## 2021-07-03 DIAGNOSIS — I509 Heart failure, unspecified: Secondary | ICD-10-CM

## 2021-07-03 MED ORDER — FUROSEMIDE 20 MG PO TABS: 20.0000 mg | ORAL_TABLET | Freq: Every day | ORAL | 1 refills | Status: DC

## 2021-07-03 MED ORDER — CITALOPRAM HYDROBROMIDE 20 MG PO TABS: 20.0000 mg | ORAL_TABLET | Freq: Every day | ORAL | 1 refills | Status: DC

## 2021-07-03 MED ORDER — PREDNISONE 10 MG PO TABS: 10.0000 mg | ORAL_TABLET | Freq: Every day | ORAL | 1 refills | Status: DC

## 2021-07-03 MED ORDER — HYDROCODONE-ACETAMINOPHEN 5-325 MG PO TABS: 1.0000 | ORAL_TABLET | ORAL | 0 refills | Status: DC

## 2021-07-03 MED ORDER — CLOPIDOGREL BISULFATE 75 MG PO TABS
75.0000 mg | ORAL_TABLET | Freq: Every day | ORAL | 1 refills | Status: DC
Start: 2021-07-03 — End: 2021-11-23

## 2021-07-03 MED ORDER — LEVOTHYROXINE SODIUM 125 MCG PO TABS: 125.0000 ug | ORAL_TABLET | Freq: Every day | ORAL | 1 refills | Status: DC
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
## 2021-07-03 NOTE — Patient Instructions (Signed)
Visit Information   Goals Addressed   None    Patient Care Plan: LCSW Plan of Care     Problem Identified: Maintain My Quality of Life through [place].   Priority: High     Goal: Maintain My Quality of Life through [facility].   Start Date: 05/16/2021  Expected End Date: 08/14/2021  This Visit's Progress: On track  Recent Progress: On track  Priority: High  Note:   Current Barriers:   Patient with Moderate Vascular Dementia, Unspecified Whether Behavioral, Psychotic, Mood Disturbance or Anxiety, Broca's Aphasia, Hemiplegia and Hemiparesis Following Cerebral Infarction Affecting Right Dominant Side, Chronic Congestive Heart Failure, and Incontinence of Bowel and Bladder needs Support, Education, Resources, Referrals, Advocacy, and Care Coordination, to resolve unmet personal care needs in the home. Patient is unable to self-administer medications as prescribed or perform ADL's/IADL's independently. Lacks knowledge of available community agencies and resources. Clinical Goals:  Patient will have [place], Aid and Attendance Benefits Services and/or [place] in place.   Patient will attend all scheduled medical appointments as evidenced by patient's daughters' report and care team review of appointment completion in electronic medical record. Patient will demonstrate improved health management independence as evidenced by having in-home care services in place. Clinical Interventions: Collaboration with Primary Care Physician, Dr. Rochel Brome regarding development and update of comprehensive plan of care as evidenced by provider attestation and co-signature. Inter-disciplinary care team collaboration (see longitudinal plan of care). Interventions performed:  Problem Solving/Task Centered, Psychoeducation/Health Education, Quality of Sleep Assessed and Sleep Hygiene Techniques Promoted, Caregiver Stress  Acknowledged and Consideration of In-Home Care Services Encouraged. Patient Goals/Self-Care Activities:  LCSW collaboration with daughter to confirm establishment of Skilled Nursing care, every two weeks, and Social Work services, once per month, both through [place] [phone]# [phone]) with [place] [phone]), as of 06/01/2021. LCSW collaboration with [place] Constable, Embedded Pharmacist at [facility] [phone]), to place order for assistance with medication management, monitoring and financial assistance.   LCSW collaboration with [place], to place order for financial assistance and resources in obtaining 2XL Pullups, Bed Pads, and Baby Wipes, preferably [employer] ([employer]) - Fragrance Free.    LCSW collaboration with Eduard Clos, Newly Embedded LCSW at [facility], to report findings of conversation with daughter on 06/23/2021, as well as to request further social work involvement for caregiver stress support.  Daughter will contact LCSW directly if she has questions, needs assistance, or if additional social work needs are identified between now and the next scheduled telephone outreach call. Follow-Up:  Scheduling Care Guides will outreach to patient's daughter to schedule follow-up outreach call with LCSW.          Patient Care Plan: CCM Pharmacy Care Plan     Problem Identified: Thyroid, Dementia, Antiplatelet   Priority: High  Onset Date: 07/03/2021     Long-Range Goal: Disease State Management   Start Date: 07/03/2021  Expected End Date: 07/03/2022  This Visit's Progress: On track  Priority: High  Note:   Current Barriers:  Unable to independently afford treatment regimen  Pharmacist Clinical Goal(s):  Patient will contact provider office for questions/concerns as evidenced notation of same in electronic health record through collaboration with PharmD and provider.    Interventions: 1:1 collaboration with Rochel Brome, MD regarding development and update of comprehensive plan of care as evidenced by provider attestation and co-signature  Inter-disciplinary care team collaboration (see longitudinal plan of care) Comprehensive medication review performed; medication list updated in electronic medical record  Heart Failure (Goal: manage symptoms and prevent exacerbations) -Controlled -Last ejection fraction: Unknown (Not seeing Cardio, was on hospice care, has DNR) -HF type: Not listed and didn't speak with patient, daughter's priority was on cost of meds today -NYHA Class: Not listed and didn't speak with patient, daughter's priority was on cost of meds today -AHA HF Stage: Not listed and didn't speak with patient, daughter's priority was on cost of meds today -Current treatment: Furosemide 20mg  QD -Medications previously tried: N/A  -Current home BP/HR readings: Doesn't test -Current dietary habits: "Tries to eat healthy" -Current exercise habits: None -Educated on Benefits of medications for managing symptoms and prolonging life -Recommended to continue current medication  Depression/Anxiety -Controlled -Current treatment: Citalopram 20mg  QD -Medications previously tried/failed: N/A -PHQ9:  Depression screen PHQ 2/9 05/16/2021  Decreased Interest 0  Down, Depressed, Hopeless 0  PHQ - 2 Score 0  -GAD7: No flowsheet data found. -Educated on Benefits of medication for symptom control -Recommended to continue current medication  Thyroid (Goal TSH: 0.4-5.0) Lab Results  Component Value Date   TSH 1.550 08/12/2019  -Controlled -Current treatment: Levothyroxine 176mcg -Counseled to take medication on an empty stomach -Recommended to continue current medication  Meds/Cost -Patient lives with daughter full time and unable to afford medications Jan 2023:  -Will ask PCP to change Loratadine/Senna to Levocetirizine/PEG so they are covered by  insurance -Can't afford Diapers/bed pads. Only gets $65/3 months to order through [employer]. However, she uses 5 diapers per day, they come through [facility] at $17/#20 which is  5 diapers/day Per quarter year  $5.88  $529.41  $2,117.65   SW is working on this, sent direct msg and she will get back to me after I told her I'll see patient tomorrow. Also forwarded note as documentation  -Patient goes without meds for a few days often because patient daughter doesn't have gas or time to go to Pharmacy. Will onboard tomorrow Verbal consent obtained for [facility] enhanced pharmacy services (medication synchronization, adherence packaging, delivery coordination). A medication sync plan was created to allow patient to get all medications delivered once every 30 to 90 days per patient preference. Patient understands they have freedom to choose pharmacy and clinical pharmacist will coordinate care between all prescribers and [facility].      Patient Goals/Self-Care Activities Patient will:  - take medications as prescribed as evidenced by patient report and record review  Follow Up Plan: The patient has been provided with contact information for the care management team and has been advised to call with any health related questions or concerns.       Ms. Frazer was given information about Chronic Care Management services today including:  CCM service includes personalized support from designated clinical staff supervised by her physician, including individualized plan of care and coordination with other care providers 24/7 contact phone numbers for assistance for urgent and routine care needs. Standard insurance, coinsurance, copays and deductibles apply for chronic care management only during months in which we provide at least 20 minutes of these services. Most insurances cover these services at 100%, however patients may be responsible for any copay, coinsurance and/or deductible  if applicable. This service may help you avoid the need for more expensive face-to-face services. Only one practitioner may furnish and bill the service in a calendar month. The patient may stop CCM services at any time (  effective at the end of the month) by phone call to the office staff.  Patient agreed to services and verbal consent obtained.   The patient verbalized understanding of instructions, educational materials, and care plan provided today and declined offer to receive copy of patient instructions, educational materials, and care plan.  The pharmacy team will reach out to the patient again over the next 90 days.   Lane Hacker, [place]

## 2021-07-04 ENCOUNTER — Ambulatory Visit: Payer: Medicare HMO | Admitting: *Deleted

## 2021-07-04 ENCOUNTER — Other Ambulatory Visit: Payer: Self-pay | Admitting: Family Medicine

## 2021-07-04 ENCOUNTER — Telehealth: Payer: Self-pay

## 2021-07-04 ENCOUNTER — Telehealth: Payer: Medicare HMO

## 2021-07-04 ENCOUNTER — Other Ambulatory Visit: Payer: Self-pay

## 2021-07-04 DIAGNOSIS — I69351 Hemiplegia and hemiparesis following cerebral infarction affecting right dominant side: Secondary | ICD-10-CM

## 2021-07-04 DIAGNOSIS — R4701 Aphasia: Secondary | ICD-10-CM

## 2021-07-04 DIAGNOSIS — I509 Heart failure, unspecified: Secondary | ICD-10-CM

## 2021-07-04 DIAGNOSIS — F0150 Vascular dementia without behavioral disturbance: Secondary | ICD-10-CM

## 2021-07-04 DIAGNOSIS — F01C0 Vascular dementia, severe, without behavioral disturbance, psychotic disturbance, mood disturbance, and anxiety: Secondary | ICD-10-CM

## 2021-07-04 MED ORDER — LEVOCETIRIZINE DIHYDROCHLORIDE 5 MG PO TABS: 5.0000 mg | ORAL_TABLET | Freq: Every evening | ORAL | 3 refills | Status: DC

## 2021-07-04 MED ORDER — LORATADINE 10 MG PO TABS: ORAL_TABLET | ORAL | 1 refills | Status: DC

## 2021-07-04 MED ORDER — SENNOSIDES 8.6 MG PO TABS: ORAL_TABLET | ORAL | 2 refills | Status: DC

## 2021-07-04 MED ORDER — DERMACLOUD EX OINT: 1.0000 "application " | TOPICAL_OINTMENT | Freq: Four times a day (QID) | CUTANEOUS | 3 refills | Status: DC | PRN

## 2021-07-04 NOTE — Progress Notes (Signed)
Onboard to Pharmacy Verbal consent obtained for [facility] enhanced pharmacy services (medication synchronization, adherence packaging, delivery coordination). A medication sync plan was created to allow patient to get all medications delivered once every 30 to 90 days per patient preference. Patient understands they have freedom to choose pharmacy and clinical pharmacist will coordinate care between all prescribers and [facility].   Medication Name                        (please note if Rx is PRN) Prescriber                                                                  (list Provider Name & Phone Number)                                  Timing    Refill Timing Last Fill Date & DS       (if last fill/DS unavailable, list pt.'s quantity on hand) Anticipated next due date    BB B L EM BT   Days Supply   Levothyroxine QD [facility] - [phone]  1    Cycle Fill 05/21/2022 90.00 08/19/22  Hydrocodone/APAP 5/325 PRN [facility] - [phone]      PRN Hold until needed Hold Until needed   Furosemide 20mg  QD [facility] - [phone]  1    Cycle Fill 05/21/2022 90.00 08/19/22  Clopidogrel 75mg  QD [facility] - [phone]  1    Cycle Fill 05/21/2022 90.00 08/19/22  Citalopram 20mg  QD [facility] - [phone]  1    Cycle Fill 07/03/2021 90.00 10/01/21  Prednisone 10mg  QD [facility] - [phone]  1    Cycle Fill 04/11/2022 90.00 07/10/22  Duoneb [facility] -      PRN 05/08/2022 30.00

## 2021-07-04 NOTE — Telephone Encounter (Signed)
Unsure of [place] instructions.   Lorita Officer, CCMA 07/04/21 11:12 AM

## 2021-07-04 NOTE — Telephone Encounter (Signed)
° °  Telephone encounter was:  Successful.  07/04/2021 Name: JERILYN GILLASPIE MRN: [id number] DOB: 1935-11-24  TRESEA HEINE is a 86 y.o. year old female who is a primary care patient of Cox, Kirsten, MD . The community resource team was consulted for assistance with  Personal care items  Care guide performed the following interventions:  - Daughter advised she does not care what brand mom uses she just needs whatever products she can get along with assistance for other the counter medicine .  Follow Up Plan:  Care guide will outreach resources to assist patient with personal care items.  [facility] Guide, Embedded Care Coordination [facility]  [place], [place] [address]  Main Phone: [phone]   E-mail: [email or web address]  Website: [email or web address]
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
## 2021-07-05 NOTE — Progress Notes (Signed)
error 

## 2021-07-05 NOTE — Patient Instructions (Signed)
Visit Information  Thank you for taking time to visit with me today. Please don't hesitate to contact me if I can be of assistance to you before our next scheduled telephone appointment.       Our next appointment is by telephone on 07/13/21 at 10:30  Please call the care guide team at [phone] if you need to cancel or reschedule your appointment.   If you are experiencing a Mental Health or Behavioral Health Crisis or need someone to talk to, please call the [place] National Suicide Prevention Lifeline: 512 797 4957 or TTY: 385-127-8117 TTY [phone]) to talk to a trained counselor go to [facility] [address] [place] [phone]) call 911   The patient verbalized understanding of instructions, educational materials, and care plan provided today and declined offer to receive copy of patient instructions, educational materials, and care plan.  Reece Levy MSW, LCSW Licensed Clinical Social Worker [facility]   [phone]

## 2021-07-06 ENCOUNTER — Telehealth: Payer: Medicare HMO

## 2021-07-11 DIAGNOSIS — F0150 Vascular dementia without behavioral disturbance: Secondary | ICD-10-CM | POA: Diagnosis not present

## 2021-07-11 DIAGNOSIS — I509 Heart failure, unspecified: Secondary | ICD-10-CM

## 2021-07-11 DIAGNOSIS — E039 Hypothyroidism, unspecified: Secondary | ICD-10-CM | POA: Diagnosis not present

## 2021-07-11 DIAGNOSIS — F01B0 Vascular dementia, moderate, without behavioral disturbance, psychotic disturbance, mood disturbance, and anxiety: Secondary | ICD-10-CM | POA: Diagnosis not present

## 2021-07-11 DIAGNOSIS — F01C0 Vascular dementia, severe, without behavioral disturbance, psychotic disturbance, mood disturbance, and anxiety: Secondary | ICD-10-CM | POA: Diagnosis not present

## 2021-07-13 ENCOUNTER — Ambulatory Visit (INDEPENDENT_AMBULATORY_CARE_PROVIDER_SITE_OTHER): Payer: Medicare HMO | Admitting: *Deleted

## 2021-07-13 DIAGNOSIS — I69351 Hemiplegia and hemiparesis following cerebral infarction affecting right dominant side: Secondary | ICD-10-CM

## 2021-07-13 DIAGNOSIS — I509 Heart failure, unspecified: Secondary | ICD-10-CM

## 2021-07-13 DIAGNOSIS — F0150 Vascular dementia without behavioral disturbance: Secondary | ICD-10-CM

## 2021-07-13 DIAGNOSIS — R4701 Aphasia: Secondary | ICD-10-CM

## 2021-07-13 NOTE — Patient Instructions (Signed)
Visit Information  Thank you for taking time to visit with me today. Please don't hesitate to contact me if I can be of assistance to you before our next scheduled telephone appointment.  Following are the goals we discussed today:  (Copy and paste patient goals from clinical care plan here)  Our next appointment is by telephone on 08/10/21    Please call the care guide team at [phone] if you need to cancel or reschedule your appointment.   If you are experiencing a Mental Health or Behavioral Health Crisis or need someone to talk to, please call the Suicide and Crisis Lifeline: 988 call 1-800-273-TALK (toll free, 24 hour hotline) call 911   The patient verbalized understanding of instructions, educational materials, and care plan provided today and declined offer to receive copy of patient instructions, educational materials, and care plan.   Reece Levy MSW, LCSW Licensed Clinical Social Worker [facility]   [phone]
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
## 2021-07-17 ENCOUNTER — Telehealth: Payer: Self-pay

## 2021-07-17 ENCOUNTER — Other Ambulatory Visit: Payer: Self-pay | Admitting: Family Medicine

## 2021-07-17 NOTE — Telephone Encounter (Signed)
Anna nurse/ care connections reporting today's visit. Patient has open wound on buttock, daughter feels this is due to not having dermacloud. Wound is 2 cm x 1.5 cm, pink in color and a little blood found in brief. Dermacloud is not covered and requesting alternative to be sent, daughter questioned if until alternative is filled if vaseline can be placed on open wound.   Lorita Officer, [place] 07/17/21 3:30 PM

## 2021-07-18 ENCOUNTER — Telehealth: Payer: Self-pay

## 2021-07-18 ENCOUNTER — Other Ambulatory Visit: Payer: Self-pay | Admitting: Family Medicine

## 2021-07-18 MED ORDER — DUODERM CGF DRESSING EX MISC: 1.0000 | CUTANEOUS | 3 refills | Status: DC

## 2021-07-18 NOTE — Telephone Encounter (Signed)
Made Veronica [name] aware. Patient uses [facility].   [name], [place] 07/18/21 8:19 AM

## 2021-07-18 NOTE — Telephone Encounter (Signed)
° °  Telephone encounter was:  Unsuccessful.  07/18/2021 Name: Veronica [name] MRN: [id number] DOB: July 03, 1935  Unsuccessful outbound call made today to assist with:   Personal Care Items  Outreach Attempt:  1st Attempt  A HIPAA compliant voice message was left requesting a return call.  Instructed patient to call back at [phone] at their earliest convenience.  [facility] Guide, Embedded Care Coordination [facility]  [place], [place] [address]  Main Phone: [phone]   E-mail: [email or web address]  Website: [email or web address]

## 2021-07-24 ENCOUNTER — Telehealth: Payer: Self-pay

## 2021-07-24 NOTE — Telephone Encounter (Signed)
° °  Telephone encounter was:  Unsuccessful.  07/24/2021 Name: Veronica [name] MRN: [id number] DOB: 04-Mar-1936  Unsuccessful outbound call made today to assist with:   Personal Care items  Outreach Attempt:  2nd Attempt  A HIPAA compliant voice message was left requesting a return call.  Instructed patient to call back at [phone] at their earliest convenience.  [facility] Guide, Embedded Care Coordination [facility]  [place], [place] [address]  Main Phone: [phone]   E-mail: [email or web address]  Website: [email or web address]

## 2021-07-24 NOTE — Telephone Encounter (Signed)
° °  Telephone encounter was:  Successful.  07/24/2021 Name: [name] MRN: [id number] DOB: 11-10-1935  Veronica [name] is a 86 y.o. year old female who is a primary care patient of Cox, Kirsten, MD . The community resource team was consulted for assistance with  Personal care needs  Care guide performed the following interventions:  Daughter called back regarding voicemail. I provided her information on founded resources. Daughter advised she could receive e-mails. Information was sent pertaining to caregiving and incontinence supplies. I also sent "[employer]" newsletter.  Follow Up Plan:  No further follow up planned at this time. The patient has been provided with needed resources. Daughter will e-mail or call for any further questions or concerns.  [place] management  [place], [place]  Main Phone: [phone]   E-mail: [email or web address]  Website: [email or web address]

## 2021-08-03 ENCOUNTER — Other Ambulatory Visit: Payer: Self-pay

## 2021-08-03 ENCOUNTER — Ambulatory Visit: Payer: Medicare HMO

## 2021-08-03 DIAGNOSIS — E039 Hypothyroidism, unspecified: Secondary | ICD-10-CM

## 2021-08-03 DIAGNOSIS — F330 Major depressive disorder, recurrent, mild: Secondary | ICD-10-CM

## 2021-08-03 DIAGNOSIS — I509 Heart failure, unspecified: Secondary | ICD-10-CM

## 2021-08-03 DIAGNOSIS — E038 Other specified hypothyroidism: Secondary | ICD-10-CM
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
## 2021-08-08 ENCOUNTER — Other Ambulatory Visit: Payer: Self-pay | Admitting: Family Medicine

## 2021-08-08 DIAGNOSIS — E039 Hypothyroidism, unspecified: Secondary | ICD-10-CM | POA: Diagnosis not present

## 2021-08-08 DIAGNOSIS — I509 Heart failure, unspecified: Secondary | ICD-10-CM | POA: Diagnosis not present

## 2021-08-08 DIAGNOSIS — E038 Other specified hypothyroidism: Secondary | ICD-10-CM

## 2021-08-08 DIAGNOSIS — F330 Major depressive disorder, recurrent, mild: Secondary | ICD-10-CM | POA: Diagnosis not present

## 2021-08-08 DIAGNOSIS — F0150 Vascular dementia without behavioral disturbance: Secondary | ICD-10-CM

## 2021-08-10 ENCOUNTER — Other Ambulatory Visit: Payer: Self-pay | Admitting: Family Medicine

## 2021-08-10 ENCOUNTER — Telehealth: Payer: Self-pay

## 2021-08-10 ENCOUNTER — Ambulatory Visit (INDEPENDENT_AMBULATORY_CARE_PROVIDER_SITE_OTHER): Payer: Medicare HMO | Admitting: *Deleted

## 2021-08-10 DIAGNOSIS — I509 Heart failure, unspecified: Secondary | ICD-10-CM

## 2021-08-10 DIAGNOSIS — F0150 Vascular dementia without behavioral disturbance: Secondary | ICD-10-CM
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
## 2021-08-10 NOTE — Patient Instructions (Signed)
Visit Information ? ?Thank you for taking time to visit with me today. Please don't hesitate to contact me if I can be of assistance to you before our next scheduled telephone appointment. ? ?Our next appointment is by telephone on 09/14/21 at 11am ? ?Please call the care guide team at [phone] if you need to cancel or reschedule your appointment.  ? ?If you are experiencing a Mental Health or Behavioral Health Crisis or need someone to talk to, please call 911  ? ?The patient verbalized understanding of instructions, educational materials, and care plan provided today and declined offer to receive copy of patient instructions, educational materials, and care plan.  ? ?Reece Levy MSW, LCSW ?Licensed Clinical Social Worker ?[facility]   ?[phone]  ?

## 2021-08-10 NOTE — Telephone Encounter (Signed)
General/Other - [place]ain Childes   ? ?Tobi Bastos, [facility] nurse visited patient today. Daughter made nurse aware of redness on inner arm. Tobi Bastos describes redness on Right inner arm going into armpit. Does look moisture associated. Daughter stated it was warm to touch but patient wears  long sleeves and blankets. They are currently using nystatin cream, antifungal cream, and baby powder to help. Tobi Bastos did educate patient and daughter on keeping this area clean and dry.  ? ?Tobi Bastos unsure if we wanted to prescribe powder as it does seem associated with moisture.  ? ?Please advise.  ?Callback#: [phone] (main office number request nurse) ? ?[name] 08/10/21 2:09 PM ? ?

## 2021-08-11 ENCOUNTER — Other Ambulatory Visit: Payer: Self-pay

## 2021-08-11 MED ORDER — NYSTATIN 100000 UNIT/GM EX POWD: 1.0000 "application " | Freq: Three times a day (TID) | CUTANEOUS | 1 refills | Status: DC

## 2021-08-11 NOTE — Telephone Encounter (Signed)
Left message for patient's daughter that RX can be sent.  Message was left to confirm pharmacy.   ?

## 2021-08-13 ENCOUNTER — Other Ambulatory Visit: Payer: Self-pay | Admitting: Family Medicine

## 2021-08-14 ENCOUNTER — Other Ambulatory Visit: Payer: Self-pay | Admitting: Family Medicine

## 2021-08-15 ENCOUNTER — Other Ambulatory Visit: Payer: Self-pay

## 2021-08-15 MED ORDER — MUPIROCIN 2 % EX OINT: 1.0000 "application " | TOPICAL_OINTMENT | Freq: Two times a day (BID) | CUTANEOUS | 1 refills | Status: DC

## 2021-08-31 ENCOUNTER — Telehealth: Payer: Self-pay

## 2021-08-31 NOTE — Telephone Encounter (Signed)
Nurse Director at [facility] calling to report changes in patient. She saw patient last week due to staffing and she noticed patient is having pitting edema to sacral are and wheezing when lying flat. She has been taking lasix daily. They request to obtain labwork on patient- recommended by medical director to obtain cbc, cmp, tsh, and bnp. She also mentions patient is have more agitation mostly at night. They request seroquel 25 mg daily at night and 1/2 tab as needed during day.  ? ? ?Spoke with Dr Marina Goodell, Dr Marina Goodell gave verbal to obtain cbc, cmp, tsh, and bnp. Also recommended to wait on labwork before giving seroquel.  ? ?She VU and will obtain labs.  ? ?[name] 08/31/21 4:29 PM ? ?

## 2021-09-04 ENCOUNTER — Encounter: Payer: Self-pay | Admitting: Family Medicine

## 2021-09-04 DIAGNOSIS — I110 Hypertensive heart disease with heart failure: Secondary | ICD-10-CM | POA: Diagnosis not present

## 2021-09-04 DIAGNOSIS — Z139 Encounter for screening, unspecified: Secondary | ICD-10-CM | POA: Diagnosis not present

## 2021-09-04 DIAGNOSIS — R946 Abnormal results of thyroid function studies: Secondary | ICD-10-CM | POA: Diagnosis not present

## 2021-09-04 LAB — BASIC METABOLIC PANEL
CO2: 29 — AB (ref 13–22)
Chloride: 102 (ref 99–108)
Creatinine: 1 (ref <=1.1)
Glucose: 146
Potassium: 3.8 mEq/L (ref 3.5–5.1)
Sodium: 138 (ref 137–147)

## 2021-09-04 LAB — HEPATIC FUNCTION PANEL
ALT: 25 U/L (ref 7–35)
AST: 24 (ref 13–35)
Alkaline Phosphatase: 67 (ref 25–125)

## 2021-09-04 LAB — CBC AND DIFFERENTIAL
HCT: 36 (ref 36–46)
Hemoglobin: 11.4 — AB (ref 12.0–16.0)
Platelets: 257 10*3/uL (ref 150–400)
WBC: 11.8

## 2021-09-04 LAB — CBC: RBC: 3.82 — AB (ref 3.87–5.11)

## 2021-09-04 LAB — TSH: TSH: 2.01 (ref 0.41–5.90)

## 2021-09-04 LAB — COMPREHENSIVE METABOLIC PANEL
Albumin: 3.3 — AB (ref 3.5–5.0)
Calcium: 8.3 — AB (ref 8.7–10.7)

## 2021-09-06 ENCOUNTER — Telehealth: Payer: Self-pay

## 2021-09-06 MED ORDER — POTASSIUM CHLORIDE CRYS ER 10 MEQ PO TBCR: 10.0000 meq | EXTENDED_RELEASE_TABLET | Freq: Every day | ORAL | 0 refills | Status: DC

## 2021-09-06 MED ORDER — FUROSEMIDE 20 MG PO TABS: 20.0000 mg | ORAL_TABLET | Freq: Two times a day (BID) | ORAL | 1 refills | Status: DC

## 2021-09-06 NOTE — Telephone Encounter (Signed)
Spoke with Mcshall. She VU and will increase lasix and begin potassium. [facility] [place]  ? ?Veronica [name] 09/06/21 3:52 PM ? ? ? ?

## 2021-09-06 NOTE — Telephone Encounter (Signed)
Veronica [name] w/ [facility] will be faxing over lab work on patient. Please reference telephone encounter from 3/23.  ? ?Labs were good,they were looking for low kidney function for possible hospice admission. However function was good. Veronica [name] is requesting extra diuretic for a few days to get fluid off patient as she has continued to have edema and respiratory issues when lying. Please advise.  ? ?Patient will continue in home palliative care with [facility]. They will continue to evaluate as needed and will transfer to hospice when needed.  ? ?Callback: [phone] ?

## 2021-09-08 DIAGNOSIS — F0390 Unspecified dementia without behavioral disturbance: Secondary | ICD-10-CM

## 2021-09-08 DIAGNOSIS — J449 Chronic obstructive pulmonary disease, unspecified: Secondary | ICD-10-CM | POA: Diagnosis not present

## 2021-09-08 DIAGNOSIS — I509 Heart failure, unspecified: Secondary | ICD-10-CM | POA: Diagnosis not present

## 2021-09-12 ENCOUNTER — Telehealth: Payer: Self-pay

## 2021-09-12 NOTE — Telephone Encounter (Signed)
Mcshall calling as she spoke with phamacist about klor-con. They feel this may not be best for patient as her meds must be crushed and she does not drink much water. Pharmacist advised she reach out for a powder. Please advise.  ? ?[name] 09/12/21 2:28 PM ? ? ? ?

## 2021-09-13 ENCOUNTER — Other Ambulatory Visit: Payer: Self-pay | Admitting: Family Medicine

## 2021-09-14 ENCOUNTER — Other Ambulatory Visit: Payer: Self-pay | Admitting: Family Medicine

## 2021-09-14 ENCOUNTER — Ambulatory Visit (INDEPENDENT_AMBULATORY_CARE_PROVIDER_SITE_OTHER): Payer: Medicare HMO | Admitting: *Deleted

## 2021-09-14 DIAGNOSIS — I509 Heart failure, unspecified: Secondary | ICD-10-CM

## 2021-09-14 DIAGNOSIS — F01518 Vascular dementia, unspecified severity, with other behavioral disturbance: Secondary | ICD-10-CM

## 2021-09-14 NOTE — Telephone Encounter (Signed)
I called patient's daughter. Voicemail is full. ?

## 2021-09-14 NOTE — Telephone Encounter (Signed)
[facility] triage nurse left VM. Mcshall reached out to them as well.  ? ?Requests update.  ? ?Veronica [name] 09/14/21 12:45 PM ? ?
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
## 2021-09-15 NOTE — Patient Instructions (Signed)
Visit Information ? ?Thank you for taking time to visit with me today. Please don't hesitate to contact me if I can be of assistance to you before our next scheduled telephone appointment. ? ?Following are the goals we discussed today:  ?(Copy and paste patient goals from clinical care plan here) ? ?Our next appointment is by telephone   ? ?Please call the care guide team at [phone] if you need to cancel or reschedule your appointment.  ? ?If you are experiencing a Mental Health or Behavioral Health Crisis or need someone to talk to, please call 911  ? ?The patient verbalized understanding of instructions, educational materials, and care plan provided today and declined offer to receive copy of patient instructions, educational materials, and care plan.  ? ?Reece Levy MSW, LCSW ?Licensed Clinical Social Worker ?[facility]   ?[phone]  ?

## 2021-09-18 ENCOUNTER — Telehealth: Payer: Self-pay

## 2021-09-18 NOTE — Progress Notes (Signed)
? ? ?  Chronic Care Management ?Pharmacy Assistant  ? ?Name: Veronica [name]  MRN: [id number] DOB: 03-05-1936 ? ? ?Reason for Encounter: Medication Coordination for [facility]  ?  ?Recent office visits: ?09/14/21 Blane Ohara MD. Orders Only. Ordered Potassium Chloride .  ? ?09/06/21 Blane Ohara MD. Telephone Encounter. Will increase lasix to 20mg  twice daily and begin potassium Chloride . ? ?Recent consult visits:  ?None ? ?Hospital visits:  ?None ? ?Medications: ?Outpatient Encounter Medications as of 09/18/2021  ?Medication Sig  ? acetaminophen (TYLENOL) 500 MG tablet Take 500 mg by mouth every 6 (six) hours as needed for mild pain or headache.  ? amoxicillin (AMOXIL) 400 MG/5ML suspension Take 6.3 mLs (500 mg total) by mouth 3 (three) times daily. (Patient not taking: Reported on 08/03/2021)  ? citalopram (CELEXA) 20 MG tablet Take 1 tablet (20 mg total) by mouth daily.  ? clopidogrel (PLAVIX) 75 MG tablet Take 1 tablet (75 mg total) by mouth daily.  ? Control Gel Formula Dressing (DUODERM CGF DRESSING) MISC Apply 1 each topically every 7 (seven) days.  ? furosemide (LASIX) 20 MG tablet Take 1 tablet (20 mg total) by mouth 2 (two) times daily.  ? HYDROcodone-acetaminophen (NORCO) 5-325 MG tablet Take 1 tablet by mouth every 4 (four) hours.  ? Infant Care Products (DERMACLOUD) OINT Apply 1 application topically 4 (four) times daily as needed (redness/pressure sores.).  ? ipratropium-albuterol (DUONEB) 0.5-2.5 (3) MG/3ML SOLN INHALE 1 VIAL VIA NEBULIZER 4 TIMES A DAY  ? levocetirizine (XYZAL) 5 MG tablet Take 1 tablet (5 mg total) by mouth every evening.  ? levothyroxine (SYNTHROID) 125 MCG tablet Take 1 tablet (125 mcg total) by mouth daily.  ? loratadine (CLARITIN) 10 MG tablet TAKE 1 TABLET BY MOUTH EVERY DAY FOR 7 DAYS  ? mupirocin ointment (BACTROBAN) 2 % Apply 1 application. topically 2 (two) times daily.  ? nystatin (MYCOSTATIN/NYSTOP) powder Apply 1 application topically 3 (three) times daily.  ?  nystatin cream (MYCOSTATIN) APPLY TO AFFECTED AREA TWICE A DAY UNDER BREAST AND ABDOMINAL FOLD  ? potassium chloride (KLOR-CON M) 10 MEQ tablet Take 1 tablet (10 mEq total) by mouth daily.  ? predniSONE (DELTASONE) 10 MG tablet Take 1 tablet (10 mg total) by mouth daily with breakfast.  ? senna ([facility] SENNA) 8.6 MG tablet TAKE 2 TABLETS BY MOUTH IN THE AM AND EVENING  ? ?No facility-administered encounter medications on file as of 09/18/2021.  ? ? ?Reviewed chart for medication changes ahead of medication coordination call. ? ?No Consults, or hospital visits since last care coordination call/Pharmacist visit. ? ?BP Readings from Last 3 Encounters:  ?04/26/21 124/64  ?03/18/20 (!) 145/55  ?10/08/19 (!) 116/54  ?  ?No results found for: HGBA1C  ? ?Patient obtains medications through Adherence Packaging  30 Days  ? ?Patient is due for her first adherence delivery on: 09/28/21. ?Called patient and reviewed medications and coordinated delivery. ? ?This delivery to include: ?Levothyroxine 09/30/21 1 at B ?Furosemide 20mg  1 at B ?Clopidogrel 75mg  1 at  B ?Citalopram 20mg  1 at B ?Prednisone 10mg  1 at B ?levocetirizine 5mg  - No RX on file. Sent request to provider  ? ? ?Unable to reach pt after several attempts, pharmacy will coordinate with pt for delivery of medications  ? ? , CMA ?Clinical Pharmacist Assistant  ?[phone]  ?

## 2021-09-20 ENCOUNTER — Other Ambulatory Visit: Payer: Self-pay

## 2021-09-20 MED ORDER — LEVOCETIRIZINE DIHYDROCHLORIDE 5 MG PO TABS: 5.0000 mg | ORAL_TABLET | Freq: Every evening | ORAL | 3 refills | Status: DC

## 2021-09-21 IMAGING — DX DG CHEST 1V PORT
1 series · 1 of 1 positions shown · non-contrast
Comparison: Portable chest 03/02/2020 and earlier.

CLINICAL DATA: 84-year-old female with cough. Positive L32QT-NY
late last month.

EXAM:
PORTABLE CHEST 1 VIEW

[chest]
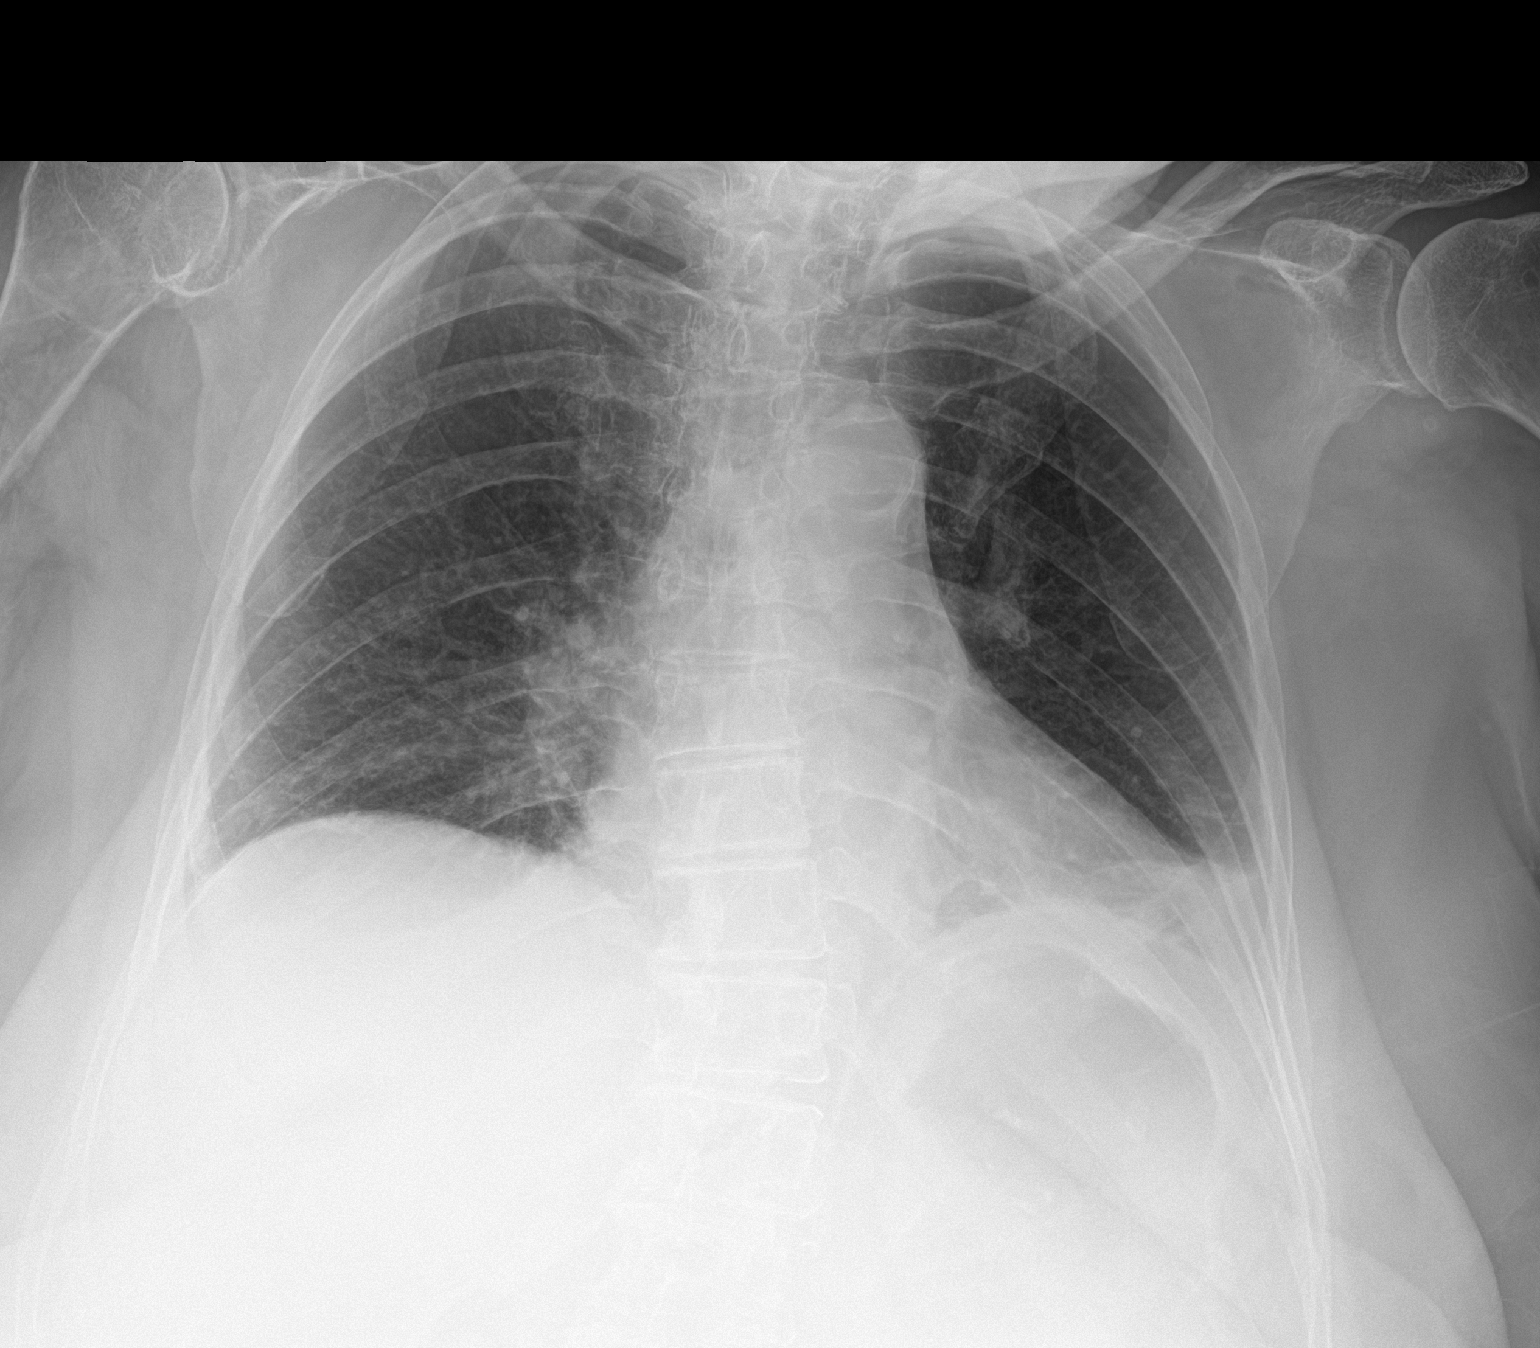

[1 of 1 positions shown; findings below may reference images not displayed]

FINDINGS: Portable AP semi upright view at 2093 hours. Stable lung volumes and
mediastinal contours. No cardiomegaly. Regressed but not resolved
streaky opacity at the left lung base since last month. No areas of
worsening ventilation. No definite pleural effusion. Paucity of
bowel gas in the upper abdomen. No acute osseous abnormality
identified.
IMPRESSION: 1. Regressed but not resolved left lung base opacity since last
month.
2. No new cardiopulmonary abnormality.

## 2021-09-25 ENCOUNTER — Telehealth: Payer: Self-pay

## 2021-09-25 NOTE — Telephone Encounter (Signed)
Veronica [name] w/ [facility] calling with update on patient. Patient has not had lasix in 3 days due to daughter not having potassium to give as well. Patient is having 3+ pitting edema from thighs down to feet. They would like to confirm once potassium powder is picked up, continue same dose of lasix with potassium?  ?Patient also has new onset of rash in peri area. Veronica [name] states it looks fungal. Patient also has a wound that has reopened on her back. This wound is not new and typically opens on occasion. Veronica [name] is requesting advisement of wound care for the daughter to do in home.  ? ?Veronica [name] did want noted that all treatments will need to be made so daughter can treat as hospice does not go to home other than every 4-6 weeks.  ? ?Veronica [name] 09/25/21 11:54 AM ? ?

## 2021-09-26 ENCOUNTER — Other Ambulatory Visit: Payer: Self-pay | Admitting: Family Medicine

## 2021-09-26 ENCOUNTER — Telehealth: Payer: Self-pay

## 2021-09-26 MED ORDER — POTASSIUM CHLORIDE 20 MEQ PO PACK: 20.0000 meq | PACK | Freq: Every day | ORAL | 0 refills | Status: DC

## 2021-09-26 NOTE — Telephone Encounter (Signed)
Attempted to call Varney Biles, unsuccessfully. [phone] ? ?Called [facility]. She VU of the plan. Wound is slightly open but she has been using dermacloud on spot. She has been using mycostatin powder on peri area starting yesterday.  ? ?Harrell Lark 09/26/21 10:44 AM ? ?

## 2021-09-26 NOTE — Telephone Encounter (Signed)
Nurse from [facility] called and wanted to know can a pill of the potassium be sent in, instead of powder, the family can't afford the powder. Also family wanted to know if you could send in the nystatin powder as well. Nurse can be reach at [phone] ?

## 2021-09-29 ENCOUNTER — Other Ambulatory Visit: Payer: Self-pay

## 2021-09-29 NOTE — Telephone Encounter (Signed)
Daughter is going to pick-up medication today.  The cost is $15.   ?

## 2021-09-29 NOTE — Telephone Encounter (Signed)
Home Health was informed.  They will repeat cmp next week.    ?

## 2021-10-05 ENCOUNTER — Other Ambulatory Visit: Payer: Self-pay | Admitting: Legal Medicine

## 2021-10-05 DIAGNOSIS — R7989 Other specified abnormal findings of blood chemistry: Secondary | ICD-10-CM | POA: Diagnosis not present

## 2021-10-08 DIAGNOSIS — I509 Heart failure, unspecified: Secondary | ICD-10-CM | POA: Diagnosis not present

## 2021-10-08 DIAGNOSIS — J449 Chronic obstructive pulmonary disease, unspecified: Secondary | ICD-10-CM | POA: Diagnosis not present

## 2021-10-10 ENCOUNTER — Other Ambulatory Visit: Payer: Self-pay | Admitting: Family Medicine

## 2021-10-12 ENCOUNTER — Telehealth: Payer: Medicare HMO

## 2021-10-19 ENCOUNTER — Ambulatory Visit (INDEPENDENT_AMBULATORY_CARE_PROVIDER_SITE_OTHER): Payer: Medicare HMO | Admitting: *Deleted

## 2021-10-19 ENCOUNTER — Telehealth: Payer: Self-pay

## 2021-10-19 DIAGNOSIS — F01518 Vascular dementia, unspecified severity, with other behavioral disturbance: Secondary | ICD-10-CM

## 2021-10-19 DIAGNOSIS — I509 Heart failure, unspecified: Secondary | ICD-10-CM

## 2021-10-19 NOTE — Progress Notes (Signed)
? ? ?Chronic Care Management ?Pharmacy Assistant  ? ?Name: Veronica [name]  MRN: [id number] DOB: Jul 30, 1935 ? ? ?Reason for Encounter: Medication Coordination for [facility]  ?  ?Recent office visits:  ?09/25/21 Blane Ohara. Telephone. Resent potassium chloride powder 20 meq per dose once per day. Restart lasix 3 daily.  ? ?Recent consult visits:  ?None ? ?Hospital visits:  ?None ? ?Medications: ?Outpatient Encounter Medications as of 10/19/2021  ?Medication Sig  ? acetaminophen (TYLENOL) 500 MG tablet Take 500 mg by mouth every 6 (six) hours as needed for mild pain or headache.  ? amoxicillin (AMOXIL) 400 MG/5ML suspension Take 6.3 mLs (500 mg total) by mouth 3 (three) times daily. (Patient not taking: Reported on 08/03/2021)  ? citalopram (CELEXA) 20 MG tablet Take 1 tablet (20 mg total) by mouth daily.  ? clopidogrel (PLAVIX) 75 MG tablet Take 1 tablet (75 mg total) by mouth daily.  ? Control Gel Formula Dressing (DUODERM CGF DRESSING) MISC Apply 1 each topically every 7 (seven) days.  ? furosemide (LASIX) 20 MG tablet Take 1 tablet (20 mg total) by mouth 2 (two) times daily.  ? HYDROcodone-acetaminophen (NORCO) 5-325 MG tablet Take 1 tablet by mouth every 4 (four) hours.  ? Infant Care Products (DERMACLOUD) OINT Apply 1 application topically 4 (four) times daily as needed (redness/pressure sores.).  ? ipratropium-albuterol (DUONEB) 0.5-2.5 (3) MG/3ML SOLN INHALE 1 VIAL VIA NEBULIZER 4 TIMES A DAY  ? levocetirizine (XYZAL) 5 MG tablet Take 1 tablet (5 mg total) by mouth every evening.  ? levothyroxine (SYNTHROID) 125 MCG tablet Take 1 tablet (125 mcg total) by mouth daily.  ? loratadine (CLARITIN) 10 MG tablet TAKE 1 TABLET BY MOUTH EVERY DAY FOR 7 DAYS  ? mupirocin ointment (BACTROBAN) 2 % APPLY 1 APPLICATION. TOPICALLY 2 (TWO) TIMES DAILY.  ? nystatin (MYCOSTATIN/NYSTOP) powder Apply 1 application topically 3 (three) times daily.  ? nystatin cream (MYCOSTATIN) APPLY TO AFFECTED AREA TWICE A DAY UNDER BREAST AND  ABDOMINAL FOLD  ? potassium chloride (KLOR-CON) 20 MEQ packet Take 20 mEq by mouth daily.  ? predniSONE (DELTASONE) 10 MG tablet Take 1 tablet (10 mg total) by mouth daily with breakfast.  ? senna ([facility] SENNA) 8.6 MG tablet TAKE 2 TABLETS BY MOUTH IN THE AM AND EVENING  ? ?No facility-administered encounter medications on file as of 10/19/2021.  ? ? ?Reviewed chart for medication changes ahead of medication coordination call. ? ?No OVs, Consults, or hospital visits since last care coordination call/Pharmacist visit.  ? ?BP Readings from Last 3 Encounters:  ?04/26/21 124/64  ?03/18/20 (!) 145/55  ?10/08/19 (!) 116/54  ?  ?No results found for: HGBA1C  ? ?Patient obtains medications through Adherence Packaging  30 Days  ? ?Last adherence delivery included: ?Levothyroxine 1 at B ?Furosemide 20mg  1 at B ?Clopidogrel 75mg  1 at  B ?Citalopram 20mg  1 at B ?Prednisone 10mg  1 at B ?levocetirizine   5mg  - No RX on file. Sent request to provider  ? ?Patient declined (meds) last month  ?Unable to reach pt ? ?I spoke with daughter about upcoming delivery and she has stated that its been more stressful keeping up with everything since being with [facility]. [facility] and she stated she [facility]. I offered if she wants to transfer all RX to [facility] have them delivered each month if that will make things easier but daughter stated she feels she has more  control over billing and when to [facility]. I stressed the benefits of [facility] but daughter appreciated everything but stated she [facility] to [facility] at this [facility]. I have informed the pharmacy.  ? ? ?Roxana Hires, CMA ?Clinical Pharmacist Assistant  ?[phone]  ?

## 2021-10-19 NOTE — Patient Instructions (Signed)
Visit Information ? ?Thank you for taking time to visit with me today. Please don't hesitate to contact me if I can be of assistance to you before our next scheduled telephone appointment. ? ?Following are the goals we discussed today: Continue to reassess pt needs/long term care needs ?LCSW collaboration with [employer]; financial assistance and resources  ?Daughter will contact LCSW directly if she has questions, needs assistance, or if additional social work needs are identified between now and the next scheduled telephone outreach call. ? ?Our next appointment is by telephone on 11/29/21 at  ? ?Please call the care guide team at [phone] if you need to cancel or reschedule your appointment.  ? ?If you are experiencing a Mental Health or Behavioral Health Crisis or need someone to talk to, please call 911  ? ?The patient verbalized understanding of instructions, educational materials, and care plan provided today and declined offer to receive copy of patient instructions, educational materials, and care plan.  ? ?Reece Levy MSW, LCSW ?Licensed Clinical Social Worker ?[facility]   ?[phone]  ?
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
## 2021-10-25 ENCOUNTER — Encounter: Payer: Self-pay | Admitting: Family Medicine

## 2021-10-25 DIAGNOSIS — I509 Heart failure, unspecified: Secondary | ICD-10-CM | POA: Diagnosis not present

## 2021-10-25 DIAGNOSIS — R7301 Impaired fasting glucose: Secondary | ICD-10-CM | POA: Diagnosis not present

## 2021-11-08 ENCOUNTER — Telehealth: Payer: Self-pay | Admitting: Family Medicine

## 2021-11-08 DIAGNOSIS — F0390 Unspecified dementia without behavioral disturbance: Secondary | ICD-10-CM

## 2021-11-08 DIAGNOSIS — I509 Heart failure, unspecified: Secondary | ICD-10-CM | POA: Diagnosis not present

## 2021-11-08 DIAGNOSIS — J449 Chronic obstructive pulmonary disease, unspecified: Secondary | ICD-10-CM | POA: Diagnosis not present

## 2021-11-08 NOTE — Telephone Encounter (Signed)
LM to schedule AWV

## 2021-11-14 ENCOUNTER — Telehealth: Payer: Self-pay

## 2021-11-14 NOTE — Telephone Encounter (Signed)
Patient's daughter was notified of her recent results.

## 2021-11-15 ENCOUNTER — Telehealth: Payer: Self-pay

## 2021-11-15 NOTE — Telephone Encounter (Signed)
HH called stating daughter wants to do lasix daily and potassium e/o day despite her still having swelling, recommended to repeat BMP in 1 week, order given (by Marianne Sofia, PA)

## 2021-11-16 ENCOUNTER — Other Ambulatory Visit: Payer: Self-pay | Admitting: Physician Assistant

## 2021-11-16 ENCOUNTER — Ambulatory Visit (INDEPENDENT_AMBULATORY_CARE_PROVIDER_SITE_OTHER): Payer: Medicare HMO

## 2021-11-16 DIAGNOSIS — E039 Hypothyroidism, unspecified: Secondary | ICD-10-CM

## 2021-11-16 DIAGNOSIS — I509 Heart failure, unspecified: Secondary | ICD-10-CM
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
## 2021-11-16 NOTE — Progress Notes (Signed)
Yes I added it to my to do list tomorrow.  Thank you

## 2021-11-17 ENCOUNTER — Telehealth: Payer: Self-pay

## 2021-11-17 NOTE — Progress Notes (Unsigned)
    Chronic Care Management Pharmacy Assistant   Name: Veronica [name]  MRN: [id number] DOB: [birth year]   Reason for Encounter: Onboard back to [facility]    Recent office visits:  None  Recent consult visits:  None  Hospital visits:  None  Medications: Outpatient Encounter Medications as of 11/17/2021  Medication Sig   acetaminophen (TYLENOL) 500 MG tablet Take 500 mg by mouth every 6 (six) hours as needed for mild pain or headache.   amoxicillin (AMOXIL) 400 MG/5ML suspension Take 6.3 mLs (500 mg total) by mouth 3 (three) times daily. (Patient not taking: Reported on 08/03/2021)   citalopram (CELEXA) 20 MG tablet Take 1 tablet (20 mg total) by mouth daily.   clopidogrel (PLAVIX) 75 MG tablet Take 1 tablet (75 mg total) by mouth daily.   Control Gel Formula Dressing (DUODERM CGF DRESSING) MISC Apply 1 each topically every 7 (seven) days.   furosemide (LASIX) 20 MG tablet Take 1 tablet (20 mg total) by mouth 2 (two) times daily.   HYDROcodone-acetaminophen (NORCO) 5-325 MG tablet Take 1 tablet by mouth every 4 (four) hours.   Infant Care Products [facility]) OINT Apply 1 application topically 4 (four) times daily as needed (redness/pressure sores.).   ipratropium-albuterol (DUONEB) 0.5-2.5 (3) MG/3ML SOLN INHALE 1 VIAL VIA NEBULIZER 4 TIMES A DAY   levocetirizine (XYZAL) 5 MG tablet Take 1 tablet (5 mg total) by mouth every evening.   levothyroxine (SYNTHROID) 125 MCG tablet Take 1 tablet (125 mcg total) by mouth daily.   loratadine (CLARITIN) 10 MG tablet TAKE 1 TABLET BY MOUTH EVERY DAY FOR 7 DAYS   mupirocin ointment (BACTROBAN) 2 % APPLY 1 APPLICATION. TOPICALLY 2 (TWO) TIMES DAILY.   nystatin (MYCOSTATIN/NYSTOP) powder Apply 1 application topically 3 (three) times daily.   nystatin cream (MYCOSTATIN) APPLY TO AFFECTED AREA TWICE A DAY UNDER BREAST AND ABDOMINAL FOLD   potassium chloride (KLOR-CON) 20 MEQ packet Take 20 mEq by mouth daily.   predniSONE (DELTASONE) 10 MG tablet  Take 1 tablet (10 mg total) by mouth daily with breakfast.   senna ([facility] SENNA) 8.6 MG tablet TAKE 2 TABLETS BY MOUTH IN THE AM AND EVENING   No facility-administered encounter medications on file as of 11/17/2021.   Onboard form has been filled out and uploaded to step 2 folder to finish completing. Pt is wanting vials w/o caps for 90ds.   Roxana Hires, CMA Clinical Pharmacist Assistant  [phone]

## 2021-11-22 ENCOUNTER — Telehealth: Payer: Self-pay

## 2021-11-22 ENCOUNTER — Other Ambulatory Visit: Payer: Self-pay | Admitting: Family Medicine

## 2021-11-22 NOTE — Telephone Encounter (Signed)
Attempted to contact daughter, left voicemail for her to call our office.

## 2021-11-22 NOTE — Telephone Encounter (Signed)
Daughter called requesting/asking if there is a cream similar to nystatin that is more cost effective. She is currently paying $17 a week for the nystatin cream.

## 2021-11-22 NOTE — Progress Notes (Incomplete)
nystatin

## 2021-11-23 ENCOUNTER — Other Ambulatory Visit: Payer: Self-pay

## 2021-11-23 DIAGNOSIS — I509 Heart failure, unspecified: Secondary | ICD-10-CM | POA: Diagnosis not present

## 2021-11-23 MED ORDER — HYDROCODONE-ACETAMINOPHEN 5-325 MG PO TABS: 1.0000 | ORAL_TABLET | ORAL | 0 refills | Status: DC

## 2021-11-23 MED ORDER — CITALOPRAM HYDROBROMIDE 20 MG PO TABS: 20.0000 mg | ORAL_TABLET | Freq: Every day | ORAL | 1 refills | Status: DC

## 2021-11-23 MED ORDER — NYSTATIN 100000 UNIT/GM EX POWD: 1.0000 | Freq: Three times a day (TID) | CUTANEOUS | 2 refills | Status: DC

## 2021-11-23 MED ORDER — POTASSIUM CHLORIDE 20 MEQ PO PACK: 20.0000 meq | PACK | Freq: Every day | ORAL | 0 refills | Status: DC

## 2021-11-23 MED ORDER — FUROSEMIDE 20 MG PO TABS: 20.0000 mg | ORAL_TABLET | Freq: Two times a day (BID) | ORAL | 1 refills | Status: DC

## 2021-11-23 MED ORDER — IPRATROPIUM-ALBUTEROL 0.5-2.5 (3) MG/3ML IN SOLN: RESPIRATORY_TRACT | 4 refills | Status: DC

## 2021-11-23 MED ORDER — CLOPIDOGREL BISULFATE 75 MG PO TABS: 75.0000 mg | ORAL_TABLET | Freq: Every day | ORAL | 1 refills | Status: DC

## 2021-11-23 MED ORDER — PREDNISONE 10 MG PO TABS: 10.0000 mg | ORAL_TABLET | Freq: Every day | ORAL | 1 refills | Status: DC

## 2021-11-23 MED ORDER — LEVOTHYROXINE SODIUM 125 MCG PO TABS
125.0000 ug | ORAL_TABLET | Freq: Every day | ORAL | 1 refills | Status: DC
Start: 2021-11-23 — End: 2022-04-19

## 2021-11-27 DIAGNOSIS — J449 Chronic obstructive pulmonary disease, unspecified: Secondary | ICD-10-CM | POA: Diagnosis not present

## 2021-11-27 DIAGNOSIS — I509 Heart failure, unspecified: Secondary | ICD-10-CM | POA: Diagnosis not present

## 2021-11-30 ENCOUNTER — Telehealth: Payer: Self-pay

## 2021-11-30 ENCOUNTER — Ambulatory Visit: Payer: Medicare HMO | Admitting: *Deleted

## 2021-11-30 DIAGNOSIS — F01518 Vascular dementia, unspecified severity, with other behavioral disturbance: Secondary | ICD-10-CM

## 2021-11-30 DIAGNOSIS — I509 Heart failure, unspecified: Secondary | ICD-10-CM

## 2021-11-30 NOTE — Patient Instructions (Signed)
Visit Information  Thank you for taking time to visit with me today. Please don't hesitate to contact me if I can be of assistance to you    Please call the care guide team at [phone] if you need to cancel or reschedule your appointment.   If you are experiencing a Mental Health or Behavioral Health Crisis or need someone to talk to, please call 911   The patient verbalized understanding of instructions, educational materials, and care plan provided today and DECLINED offer to receive copy of patient instructions, educational materials, and care plan.   Reece Levy MSW, LCSW Licensed Clinical Social Worker [facility]   [phone]

## 2021-11-30 NOTE — Chronic Care Management (AMB) (Signed)
    Chronic Care Management Pharmacy Assistant   Name: JURNI CESARO  MRN: [id number] DOB: 31-Dec-1935  Reason for Encounter: Prior Authorization Coordination  Prior authorization created in Covermymeds for Potassium Chloride 20 meq packets and Norco 5/325 mg.   Potassium Chloride PA Case: [id number], Status: Approved, Coverage Starts on: 06/11/2021 12:00:00 AM, Coverage Ends on: 06/10/2022 12:00:00 AM.  Attempted Prior authorization twice for Norco, [employer] prior authorization department does not manage prior authorizations for this plan, need updated insurance card.  Per Pharmacy I should try [facility] authorization. Sent through [employer].  Unable to get approval through [facility], will have pharmacy place medication on hold. Will have team member reach out to patient next week on insurance status and needs of medication, patient may need to pay cash price.   Medications: Outpatient Encounter Medications as of 11/30/2021  Medication Sig   nystatin (MYCOSTATIN/NYSTOP) powder Apply 1 Application topically 3 (three) times daily. NEEDS LARGEST QUANTITY!!   acetaminophen (TYLENOL) 500 MG tablet Take 500 mg by mouth every 6 (six) hours as needed for mild pain or headache.   citalopram (CELEXA) 20 MG tablet Take 1 tablet (20 mg total) by mouth daily.   clopidogrel (PLAVIX) 75 MG tablet Take 1 tablet (75 mg total) by mouth daily.   Control Gel Formula Dressing (DUODERM CGF DRESSING) MISC Apply 1 each topically every 7 (seven) days.   furosemide (LASIX) 20 MG tablet Take 1 tablet (20 mg total) by mouth 2 (two) times daily.   HYDROcodone-acetaminophen (NORCO) 5-325 MG tablet Take 1 tablet by mouth every 4 (four) hours.   Infant Care Products [facility]) OINT Apply 1 application topically 4 (four) times daily as needed (redness/pressure sores.).   ipratropium-albuterol (DUONEB) 0.5-2.5 (3) MG/3ML SOLN Inhale 1 vial via nebulizer 4 times a day   levocetirizine (XYZAL) 5 MG tablet Take 1 tablet (5 mg  total) by mouth every evening.   levothyroxine (SYNTHROID) 125 MCG tablet Take 1 tablet (125 mcg total) by mouth daily.   loratadine (CLARITIN) 10 MG tablet TAKE 1 TABLET BY MOUTH EVERY DAY FOR 7 DAYS   mupirocin ointment (BACTROBAN) 2 % APPLY 1 APPLICATION. TOPICALLY 2 (TWO) TIMES DAILY.   nystatin cream (MYCOSTATIN) APPLY TO AFFECTED AREA TWICE A DAY UNDER BREAST AND ABDOMINAL FOLD   potassium chloride (KLOR-CON) 20 MEQ packet Take 20 mEq by mouth daily.   predniSONE (DELTASONE) 10 MG tablet Take 1 tablet (10 mg total) by mouth daily with breakfast.   senna ([facility] SENNA) 8.6 MG tablet TAKE 2 TABLETS BY MOUTH IN THE AM AND EVENING   No facility-administered encounter medications on file as of 11/30/2021.    Billee Cashing, CMA Clinical Pharmacist Assistant [phone]
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
## 2021-12-07 ENCOUNTER — Other Ambulatory Visit: Payer: Self-pay | Admitting: Family Medicine

## 2021-12-08 DIAGNOSIS — E039 Hypothyroidism, unspecified: Secondary | ICD-10-CM

## 2021-12-08 DIAGNOSIS — F32A Depression, unspecified: Secondary | ICD-10-CM

## 2021-12-08 DIAGNOSIS — I509 Heart failure, unspecified: Secondary | ICD-10-CM

## 2021-12-13 NOTE — Progress Notes (Addendum)
12/13/21- Called to follow up with pt daughter in regards to her hydrocodone to see what they want to do but lvm to return my call.   12/18/21-Spoke with daughter and she stated her mom is on hospice and thinks they are managing her medications but will reach out to them and let us know.   Roxana Hires, CMA Clinical Pharmacist Assistant  [phone]

## 2021-12-25 NOTE — Progress Notes (Signed)
I called daughter back today to follow up if Hospice is taking over filling her meds but her daughter forgot to ask the hospice nurse but will be there this week and she will ask and I will follow up next week   Roxana Hires, [facility] Clinical Pharmacist Assistant  [phone]

## 2022-01-04 ENCOUNTER — Telehealth: Payer: Medicare HMO

## 2022-01-04 NOTE — Progress Notes (Signed)
01/04/22- Made a 3rd attempt to contact daughter in regards if they are wanting to stay with [facility] or if Hospice is taking over pt medications. I had to leave a vm.  Roxana Hires, CMA Clinical Pharmacist Assistant  [phone]

## 2022-01-10 ENCOUNTER — Telehealth: Payer: Self-pay

## 2022-01-10 NOTE — Progress Notes (Signed)
    Chronic Care Management Pharmacy Assistant   Name: Veronica [name]  MRN: [id number] DOB: 06/03/1936   Reason for Encounter: Medication Review  Spoke to pt daughter today in regards to if [facility] is going to continue filling her moms medications. She stated [facility]. The daughter seems to think that's best for now because her medications are constantly changing and its easier for them to just go pick them up when needed. I told the daughter we are always here if she needs [place]. [facility] and hospice is coordinating her medications. I informed the pharmacy.   Medications: Outpatient Encounter Medications as of 01/10/2022  Medication Sig   acetaminophen (TYLENOL) 500 MG tablet Take 500 mg by mouth every 6 (six) hours as needed for mild pain or headache.   citalopram (CELEXA) 20 MG tablet Take 1 tablet (20 mg total) by mouth daily.   clopidogrel (PLAVIX) 75 MG tablet Take 1 tablet (75 mg total) by mouth daily.   Control Gel Formula Dressing (DUODERM CGF DRESSING) MISC Apply 1 each topically every 7 (seven) days.   furosemide (LASIX) 20 MG tablet Take 1 tablet (20 mg total) by mouth 2 (two) times daily.   HYDROcodone-acetaminophen (NORCO) 5-325 MG tablet Take 1 tablet by mouth every 4 (four) hours.   Infant Care Products [facility]) OINT Apply 1 application topically 4 (four) times daily as needed (redness/pressure sores.).   ipratropium-albuterol (DUONEB) 0.5-2.5 (3) MG/3ML SOLN Inhale 1 vial via nebulizer 4 times a day   levocetirizine (XYZAL) 5 MG tablet Take 1 tablet (5 mg total) by mouth every evening.   levothyroxine (SYNTHROID) 125 MCG tablet Take 1 tablet (125 mcg total) by mouth daily.   loratadine (CLARITIN) 10 MG tablet TAKE 1 TABLET BY MOUTH EVERY DAY FOR 7 DAYS   mupirocin ointment (BACTROBAN) 2 % APPLY 1 APPLICATION. TOPICALLY 2 (TWO) TIMES DAILY.   nystatin (MYCOSTATIN/NYSTOP) powder Apply 1 Application  topically 3 (three) times daily. NEEDS LARGEST QUANTITY!!   nystatin cream (MYCOSTATIN) APPLY TO AFFECTED AREA TWICE A DAY UNDER BREAST AND ABDOMINAL FOLD   potassium chloride (KLOR-CON M10) 10 MEQ tablet Take 2 tablets (20 mEq total) by mouth daily.   predniSONE (DELTASONE) 10 MG tablet Take 1 tablet (10 mg total) by mouth daily with breakfast.   senna ([facility] SENNA) 8.6 MG tablet TAKE 2 TABLETS BY MOUTH IN THE AM AND EVENING   No facility-administered encounter medications on file as of 01/10/2022.    Roxana Hires, CMA Clinical Pharmacist Assistant  [phone]

## 2022-02-15 ENCOUNTER — Other Ambulatory Visit: Payer: Self-pay | Admitting: Family Medicine

## 2022-02-27 ENCOUNTER — Telehealth: Payer: Medicare HMO

## 2022-04-05 ENCOUNTER — Other Ambulatory Visit: Payer: Self-pay | Admitting: Family Medicine

## 2022-04-19 ENCOUNTER — Other Ambulatory Visit: Payer: Self-pay | Admitting: Family Medicine

## 2022-05-13 ENCOUNTER — Other Ambulatory Visit: Payer: Self-pay | Admitting: Family Medicine

## 2022-05-28 ENCOUNTER — Other Ambulatory Visit: Payer: Self-pay | Admitting: Family Medicine

## 2022-07-05 ENCOUNTER — Other Ambulatory Visit: Payer: Self-pay | Admitting: Family Medicine

## 2022-07-11 ENCOUNTER — Telehealth: Payer: Self-pay | Admitting: Family Medicine

## 2022-07-11 NOTE — Telephone Encounter (Signed)
Please call for an appointment. Must be seen in the next 2 months, or no further prescriptions will be sent.  May be virtual. Dr. Tobie Poet

## 2022-07-16 NOTE — Telephone Encounter (Signed)
Patient bedridden and is with hospice.

## 2022-07-18 NOTE — Telephone Encounter (Signed)
Called daughter and she stated that they are back and forth with palliative care and hospice. If she does not get worst in 3 months they will move her back to palliative care. She would like to continue seeing Dr. Domenic Polite has been made

## 2022-08-03 ENCOUNTER — Other Ambulatory Visit: Payer: Self-pay | Admitting: Family Medicine

## 2022-08-26 ENCOUNTER — Other Ambulatory Visit: Payer: Self-pay | Admitting: Family Medicine

## 2022-09-04 ENCOUNTER — Other Ambulatory Visit: Payer: Self-pay | Admitting: Family Medicine

## 2022-09-06 ENCOUNTER — Telehealth (INDEPENDENT_AMBULATORY_CARE_PROVIDER_SITE_OTHER): Payer: Medicare Other | Admitting: Family Medicine

## 2022-09-06 ENCOUNTER — Encounter: Payer: Self-pay | Admitting: Family Medicine

## 2022-09-06 DIAGNOSIS — I509 Heart failure, unspecified: Secondary | ICD-10-CM | POA: Diagnosis not present

## 2022-09-06 DIAGNOSIS — J9611 Chronic respiratory failure with hypoxia: Secondary | ICD-10-CM

## 2022-09-06 DIAGNOSIS — F330 Major depressive disorder, recurrent, mild: Secondary | ICD-10-CM | POA: Diagnosis not present

## 2022-09-06 DIAGNOSIS — L89301 Pressure ulcer of unspecified buttock, stage 1: Secondary | ICD-10-CM

## 2022-09-06 DIAGNOSIS — F01C0 Vascular dementia, severe, without behavioral disturbance, psychotic disturbance, mood disturbance, and anxiety: Secondary | ICD-10-CM

## 2022-09-06 DIAGNOSIS — R3981 Functional urinary incontinence: Secondary | ICD-10-CM

## 2022-09-06 DIAGNOSIS — R1312 Dysphagia, oropharyngeal phase: Secondary | ICD-10-CM

## 2022-09-06 DIAGNOSIS — R159 Full incontinence of feces: Secondary | ICD-10-CM

## 2022-09-06 DIAGNOSIS — J9601 Acute respiratory failure with hypoxia: Secondary | ICD-10-CM

## 2022-09-06 DIAGNOSIS — I69351 Hemiplegia and hemiparesis following cerebral infarction affecting right dominant side: Secondary | ICD-10-CM

## 2022-09-06 DIAGNOSIS — U071 COVID-19: Secondary | ICD-10-CM

## 2022-09-06 DIAGNOSIS — E039 Hypothyroidism, unspecified: Secondary | ICD-10-CM | POA: Diagnosis not present

## 2022-09-06 DIAGNOSIS — R4701 Aphasia: Secondary | ICD-10-CM

## 2022-09-06 MED ORDER — MUPIROCIN 2 % EX OINT: 1.0000 | TOPICAL_OINTMENT | Freq: Two times a day (BID) | CUTANEOUS | 3 refills | Status: DC

## 2022-09-06 NOTE — Progress Notes (Signed)
Virtual Visit via Video Note   This visit type was conducted per family request due to patient being bed bound.   This format is felt to be most appropriate for this patient at this time.  All issues noted in this document were discussed and addressed.  A limited physical exam was performed with this format.  A verbal consent was obtained for the virtual visit.   Date:  09/09/2022   ID:  Veronica [name], DOB [birth year], MRN [id number]  Patient Location: Home Provider Location: Office/Clinic  PCP:  Veronica Brome, MD   Chief Complaint:  Dementia, Hypothyroidism, CHF.  HPI: The patient does not have symptoms concerning for COVID-19 infection (fever, chills, cough, or new shortness of breath).   [facility]. Hypothyroidism: Taking Levothyroxine 125 mcg [name].  Depression: Takes Celexa 20 mg every morning  Hypoxia: 2 L oxygen. ON duoneb 2-3 times per day.   CONGESTIVE HEART FAILURE: on lasix 20 mg [name].   Eats pretty well in the morning. Cream of wheat for breakfast. Banana and/or pudding at lunch time. Stauffer's meals and pureeing food. This helps prevent choking.   Broca's: able to communicate some. Waxes and wanes.   Pressure sores 2 on buttock. One is healing. Using barrier cream.   Vascular dementia: very poor memory. On plavix 75 mg once [name].   Bowel and bladder incontinence: wearing depends.   Nasal congestion: uses flonase and nasal salie.   Chronic pain: ibuprofen and tylenol help. Intolerant to morphine.Marland Kitchenknocked her out for few days and then unable to feed herself or drink.  Able to take hydrocodone. Has oxycodone, but not using.    Past Medical History:  Diagnosis Date   Atrophy of thyroid    CHF (congestive heart failure) ([place])    Depression    Hypothyroidism    Migraines    Stroke ([place]) 2004   Vascular dementia [facility])     Past Surgical History:  Procedure Laterality Date   ABDOMINAL HYSTERECTOMY      Family History   Problem Relation Age of Onset   Stroke Other    Hypertension Other    CAD Other    Cancer Other     Social History   Socioeconomic History   Marital status: Widowed    Spouse name: Not on file   Number of children: 3   Years of education: 95   Highest education level: 12th grade  Occupational History   Not on file  Tobacco Use   Smoking status: Never    Passive exposure: Never   Smokeless tobacco: Never  Vaping Use   Vaping Use: Never used  Substance and Sexual Activity   Alcohol use: Never   Drug use: Never   Sexual activity: Not Currently  Other Topics Concern   Not on file  Social History Narrative   Not on file   Social Determinants of Health   Financial Resource Strain: High Risk (11/16/2021)   Overall Financial Resource Strain (CARDIA)    Difficulty of Paying Living Expenses: Hard  Food Insecurity: No Food Insecurity (05/16/2021)   Hunger Vital Sign    Worried About Running Out of Food in the Last Year: Never true    Ran Out of Food in the Last Year: Never true  Transportation Needs: Unmet Transportation Needs (11/16/2021)   PRAPARE - Transportation    Lack of Transportation (Medical): Yes    Lack of Transportation (Non-Medical): Yes  Physical Activity: Inactive (07/03/2021)  Exercise Vital Sign    Days of Exercise per Week: 0 days    Minutes of Exercise per Session: 0 min  Stress: No Stress Concern Present (05/16/2021)   [place]    Feeling of Stress : Only a little  Social Connections: Moderately Isolated (05/16/2021)   Social Connection and Isolation Panel [NHANES]    Frequency of Communication with Friends and Family: More than three times a week    Frequency of Social Gatherings with Friends and Family: More than three times a week    Attends Religious Services: 1 to 4 times per year    Active Member of [employer] or Organizations: No    Attends Archivist Meetings: Never     Marital Status: Widowed  Intimate Partner Violence: Not At Risk (05/16/2021)   Humiliation, Afraid, Rape, and Kick questionnaire    Fear of Current or Ex-Partner: No    Emotionally Abused: No    Physically Abused: No    Sexually Abused: No    Outpatient Medications Prior to Visit  Medication Sig Dispense Refill   acetaminophen (TYLENOL) 500 MG tablet Take 500 mg by mouth every 6 (six) hours as needed for mild pain or headache.     ATIVAN 0.5 MG tablet Take 0.25-0.5 mg by mouth every 8 (eight) hours as needed.     citalopram (CELEXA) 20 MG tablet TAKE 1 TABLET BY MOUTH EVERY DAY IN THE MORNING 30 tablet 2   clopidogrel (PLAVIX) 75 MG tablet TAKE 1 TABLET BY MOUTH EVERY DAY IN THE MORNING 90 tablet 1   Control Gel Formula Dressing (DUODERM CGF DRESSING) MISC Apply 1 each topically every 7 (seven) days. 20 each 3   [facility]S IBUPROFEN 200 MG tablet Take 200 mg by mouth every 6 (six) hours as needed.     furosemide (LASIX) 20 MG tablet TAKE 1 TABLET BY MOUTH EVERY DAY IN THE MORNING 30 tablet 2   haloperidol (HALDOL) 0.5 MG tablet PLEASE SEE ATTACHED FOR DETAILED DIRECTIONS     HYDROcodone-acetaminophen (NORCO) 5-325 MG tablet Take 1 tablet by mouth every 4 (four) hours. 84 tablet 0   Hydrocortisone Acetate 1 % CREA SMARTSIG:1 Sparingly Topical Twice [name]     hydrocortisone cream 1 % SMARTSIG:1 Sparingly Topical Twice [name]     Hydrocortisone, Perianal, 1 % CREA SMARTSIG:1 Sparingly Topical Twice [name]     Infant Care Products (DERMACLOUD) OINT Apply 1 application topically 4 (four) times [name] as needed (redness/pressure sores.). 430 g 3   ipratropium-albuterol (DUONEB) 0.5-2.5 (3) MG/3ML SOLN Inhale 1 vial via nebulizer 4 times a day 180 mL 4   levocetirizine (XYZAL) 5 MG tablet Take 1 tablet (5 mg total) by mouth every evening. 90 tablet 3   levothyroxine (SYNTHROID) 125 MCG tablet TAKE 1 TABLET BY MOUTH EVERY MORNING 30 tablet 0   loratadine (CLARITIN) 10 MG tablet TAKE 1 TABLET BY MOUTH EVERY  DAY FOR 7 DAYS 90 tablet 1   nystatin (MYCOSTATIN/NYSTOP) powder Apply 1 Application topically 3 (three) times [name]. NEEDS LARGEST QUANTITY!! 60 g 2   nystatin cream (MYCOSTATIN) APPLY TO AFFECTED AREA TWICE A DAY UNDER BREAST AND ABDOMINAL FOLD 30 g 3   OXYCODONE HCL PO Take 5 mg by mouth every 6 (six) hours as needed.     potassium chloride (KLOR-CON M10) 10 MEQ tablet Take 2 tablets (20 mEq total) by mouth [name]. 180 tablet 0   predniSONE (DELTASONE) 10 MG tablet TAKE 1 TABLET  BY MOUTH EVERY DAY IN THE MORNING 30 tablet 1   senna (CVS SENNA) 8.6 MG tablet TAKE 2 TABLETS BY MOUTH IN THE AM AND EVENING 56 tablet 2   THICK-IT POWD Take by mouth.     mupirocin ointment (BACTROBAN) 2 % APPLY 1 APPLICATION. TOPICALLY 2 (TWO) TIMES [name]. 22 g 1   No facility-administered medications prior to visit.    Allergies  Allergen Reactions   Cheese Other (See Comments)    headache   Chocolate Other (See Comments)    headache   Dextromethorphan    Lidoderm [Lidocaine]     "Made her crazy"     Social History   Tobacco Use   Smoking status: Never    Passive exposure: Never   Smokeless tobacco: Never  Vaping Use   Vaping Use: Never used  Substance Use Topics   Alcohol use: Never   Drug use: Never     Review of Systems  Constitutional:  Negative for chills and fever.  HENT:  Negative for ear pain and sinus pain.   Respiratory:  Positive for shortness of breath and wheezing. Negative for cough.   Cardiovascular:  Positive for leg swelling. Negative for chest pain and palpitations.  Gastrointestinal:  Negative for abdominal pain, constipation, diarrhea, nausea and vomiting.  Genitourinary:  Negative for dysuria and hematuria.  Musculoskeletal:  Positive for back pain and joint pain.     Labs/Other Tests and Data Reviewed:    Recent Labs: No results found for requested labs within last 365 days.   Recent Lipid Panel Lab Results  Component Value Date/Time   TRIG 98 03/02/2020 01:26  PM    Wt Readings from Last 3 Encounters:  03/17/20 181 lb 7 oz (82.3 kg)     Objective:    Vital Signs:  There were no vitals taken for this visit.   Physical Exam Constitutional:      General: She is not in acute distress.    Comments: Laying in bed with oxygen 2 L on.  95% oxygen sat.  Smiles.   Skin:    Findings: Rash (rt shoulder. open sore.) present.  Neurological:     Mental Status: She is alert.      ASSESSMENT & PLAN:   Acquired hypothyroidism Needs tsh checked.   Broca's aphasia Able to communicate with Daughter somewhat. Stable.  Chronic CHF (congestive heart failure) (HCC) Continue lasix 20 mg [name].   Chronic respiratory failure with hypoxia (HCC) Continue home oxygen 2 L [name].   Hemiplegia and hemiparesis following cerebral infarction affecting right dominant side (HCC) Continue plavix 75 mg [name].   Mild recurrent major depression ([place]) Continue ativan  Oropharyngeal dysphagia Continue to puree food. Encouraged daughter to incorporate more protein.  Vascular dementia ([place]) Stable   Full incontinence of feces Continue to use depends.   Functional urinary incontinence Continue to use depends.  Pressure injury of skin Continue to barrier cream.    No orders of the defined types were placed in this encounter.    Meds ordered this encounter  Medications   mupirocin ointment (BACTROBAN) 2 %    Sig: Apply 1 Application topically 2 (two) times [name].    Dispense:  90 g    Refill:  3    I spent 30 minutes dedicated to the care of this patient on the date of this encounter to include face-to-face time with the patient.  Follow Up:  Virtual Visit  in 6 month(s)  Signed, Veronica Brome, MD  09/09/2022 8:43 PM    [place]

## 2022-09-09 DIAGNOSIS — F330 Major depressive disorder, recurrent, mild: Secondary | ICD-10-CM | POA: Insufficient documentation

## 2022-09-09 NOTE — Assessment & Plan Note (Signed)
Continue to puree food. Encouraged daughter to incorporate more protein.

## 2022-09-09 NOTE — Assessment & Plan Note (Signed)
Needs tsh checked

## 2022-09-09 NOTE — Assessment & Plan Note (Signed)
Continue ativan 

## 2022-09-09 NOTE — Assessment & Plan Note (Signed)
Able to communicate with Daughter somewhat. Stable.

## 2022-09-09 NOTE — Assessment & Plan Note (Signed)
Continue home oxygen 2 L daily.

## 2022-09-09 NOTE — Assessment & Plan Note (Signed)
Continue to use depends.

## 2022-09-09 NOTE — Assessment & Plan Note (Signed)
-   Continue lasix 20mg daily

## 2022-09-09 NOTE — Assessment & Plan Note (Signed)
Continue to barrier cream.

## 2022-09-09 NOTE — Assessment & Plan Note (Signed)
Stable

## 2022-09-09 NOTE — Assessment & Plan Note (Signed)
Continue plavix 75mg daily.  

## 2022-09-20 ENCOUNTER — Other Ambulatory Visit: Payer: Self-pay | Admitting: Family Medicine

## 2023-01-20 ENCOUNTER — Other Ambulatory Visit: Payer: Self-pay | Admitting: Family Medicine

## 2023-02-18 ENCOUNTER — Other Ambulatory Visit: Payer: Self-pay | Admitting: Family Medicine

## 2023-03-11 ENCOUNTER — Telehealth (INDEPENDENT_AMBULATORY_CARE_PROVIDER_SITE_OTHER): Payer: Medicare Other | Admitting: Family Medicine

## 2023-03-11 ENCOUNTER — Encounter: Payer: Self-pay | Admitting: Family Medicine

## 2023-03-11 VITALS — HR 82 | Temp 99.8°F

## 2023-03-11 DIAGNOSIS — I509 Heart failure, unspecified: Secondary | ICD-10-CM

## 2023-03-11 DIAGNOSIS — F330 Major depressive disorder, recurrent, mild: Secondary | ICD-10-CM

## 2023-03-11 DIAGNOSIS — F01518 Vascular dementia, unspecified severity, with other behavioral disturbance: Secondary | ICD-10-CM

## 2023-03-11 DIAGNOSIS — I69351 Hemiplegia and hemiparesis following cerebral infarction affecting right dominant side: Secondary | ICD-10-CM | POA: Diagnosis not present

## 2023-03-11 DIAGNOSIS — E039 Hypothyroidism, unspecified: Secondary | ICD-10-CM | POA: Diagnosis not present

## 2023-03-11 DIAGNOSIS — R4701 Aphasia: Secondary | ICD-10-CM

## 2023-03-11 DIAGNOSIS — J9611 Chronic respiratory failure with hypoxia: Secondary | ICD-10-CM

## 2023-03-11 DIAGNOSIS — L89301 Pressure ulcer of unspecified buttock, stage 1: Secondary | ICD-10-CM | POA: Diagnosis not present

## 2023-03-11 DIAGNOSIS — F01B0 Vascular dementia, moderate, without behavioral disturbance, psychotic disturbance, mood disturbance, and anxiety: Secondary | ICD-10-CM | POA: Diagnosis not present

## 2023-03-11 MED ORDER — FUROSEMIDE 20 MG PO TABS: 20.0000 mg | ORAL_TABLET | Freq: Two times a day (BID) | ORAL | 5 refills | Status: DC

## 2023-03-11 NOTE — Assessment & Plan Note (Signed)
Stable

## 2023-03-11 NOTE — Progress Notes (Signed)
Virtual Visit via Video Note   This visit type was conducted due to national recommendations for restrictions regarding the COVID-19 Pandemic (e.g. social distancing) in an effort to limit this patient's exposure and mitigate transmission in our community.  Due to her co-morbid illnesses, this patient is at least at moderate risk for complications without adequate follow up.  This format is felt to be most appropriate for this patient at this time.  All issues noted in this document were discussed and addressed.  A limited physical exam was performed with this format.  A verbal consent was obtained for the virtual visit.   Patient Location:home Provider Location:office Evaluation Performed:  Follow-Up Visit  Subjective:  Patient ID: Veronica [name], female    DOB: 1935-11-12  Age: 87 y.o. MRN: [id number]  Chief Complaint  Patient presents with   Hypothyroidism    HPI The patient does not have symptoms concerning for COVID-19 infection (fever, chills, cough, or new shortness of breath).  History was given entirely by her daughter who is her primary caretaker. [facility]  Ms. Veronica [name] has been deteriorating.  She has had a fever and a treatment for sinus infection twice in the last 4 weeks by hospice.  She is currently on her second course of Augmentin.  Symptoms include sinus congestion as well as her head hurts.  This is indicated by her shaking her head back-and-forth which is typically her signal for having sinus infections.  She is having some difficulty eating and at times is confused.  She is significant increased and somnolence.  Patient is also having issues with constipation and has had to be disimpacted.  The nurse is coming out today and will be inserting a rectal suppository in the hopes of her bowels moving.  In addition the patient has had some issues with her toes.  Her toes have become red around the nails and she has had some nails that have  fallen off.  Hospice has applied Medihoney to use on her toes and feet.  She has had some choking when she has been eating but no significant coughing.  She is on 2 L of oxygen continuously.  Her daughter is unsure if the medical providers from hospice are providing her care (physician, nurse practitioners.) I have not received any phone calls approving any of the medications above so my belief is they are providing her physician/provider care.  Hypothyroidism: Taking Levothyroxine 125 mcg daily.  Depression: Takes Celexa 20 mg every morning  Hypoxia: 2 L oxygen. ON duoneb 2-3 times per day.   CONGESTIVE HEART FAILURE: on lasix 20 mg daily.   Broca's: able to communicate some. Waxes and wanes.   Pressure sores 2 on buttock. One is healing. Using barrier cream. Using Baza cream or balmex.   Vascular dementia: very poor memory. On plavix 75 mg once daily.   Bowel and bladder incontinence: wearing depends (TENA.)   Nasal congestion: uses flonase and nasal saline.   Chronic pain: ibuprofen and tylenol help. Able to take hydrocodone for severe pain however she has not had a new prescription in many months.  Per the daughter the hospice nurse, Veronica [name], checked and it is not out of date.      05/16/2021    1:38 PM  Depression screen PHQ 2/9  Decreased Interest 0  Down, Depressed, Hopeless 0  PHQ - 2 Score 0        05/16/2021    1:43  PM  Fall Risk   Falls in the past year? 1  Number falls in past yr: 1  Injury with Fall? 1  Risk for fall due to : History of fall(s);Impaired balance/gait;Impaired mobility;Mental status change  Follow up Falls evaluation completed;Education provided;Falls prevention discussed    Patient Care Team: Blane Ohara, MD as PCP - General (Internal Medicine) [place] Registered Nurse [facility] and Palliative Medicine) Zettie Pho, [facility] (Inactive) as Pharmacist (Pharmacist)   Review of Systems  Constitutional:  Positive for activity  change, appetite change, fatigue and fever.  HENT:  Negative for congestion.   Respiratory:  Positive for choking (OCCASIONALLY).   Gastrointestinal:  Positive for constipation and nausea. Negative for diarrhea and vomiting.  Psychiatric/Behavioral:  Positive for dysphoric mood (CRIES SOMETIMES AND SAYS SHE IS READY TO GO HOME.) and sleep disturbance (SOMNOLENCE). Negative for agitation and behavioral problems.     Current Outpatient Medications on File Prior to Visit  Medication Sig Dispense Refill   acetaminophen (TYLENOL) 500 MG tablet Take 500 mg by mouth every 6 (six) hours as needed for mild pain or headache.     amoxicillin-clavulanate (AUGMENTIN) 250-125 MG tablet Take 1 tablet by mouth 3 (three) times daily.     ATIVAN 0.5 MG tablet Take 0.25-0.5 mg by mouth every 8 (eight) hours as needed.     BALMEX COMPLETE PROTECTION 11.3 % CREA cream Apply 1 Application topically daily.     BAZA PROTECT MOISTURE BARRIER 12 % CREA Apply 1 Application topically daily.     bisacodyl (DULCOLAX) 10 MG suppository Place 10 mg rectally daily as needed for moderate constipation.     citalopram (CELEXA) 20 MG tablet TAKE 1 TABLET BY MOUTH EVERY DAY IN THE MORNING 30 tablet 2   clopidogrel (PLAVIX) 75 MG tablet TAKE 1 TABLET BY MOUTH EVERY DAY IN THE MORNING 30 tablet 2   CVS IBUPROFEN 200 MG tablet Take 200 mg by mouth every 6 (six) hours as needed.     ferrous sulfate 325 (65 FE) MG EC tablet Take 325 mg by mouth in the morning and at bedtime.     haloperidol (HALDOL) 0.5 MG tablet PLEASE SEE ATTACHED FOR DETAILED DIRECTIONS     Hydrocortisone Acetate 1 % CREA SMARTSIG:1 Sparingly Topical Twice Daily     ipratropium-albuterol (DUONEB) 0.5-2.5 (3) MG/3ML SOLN Inhale 1 vial via nebulizer 4 times a day 180 mL 4   levothyroxine (SYNTHROID) 125 MCG tablet TAKE 1 TABLET BY MOUTH EVERY MORNING 30 tablet 0   loratadine (CLARITIN) 10 MG tablet TAKE 1 TABLET BY MOUTH EVERY DAY FOR 7 DAYS 90 tablet 1   Multiple  Vitamin (MULTIVITAMIN) capsule Take 1 capsule by mouth daily.     mupirocin ointment (BACTROBAN) 2 % Apply 1 Application topically 2 (two) times daily. 90 g 3   nystatin (MYCOSTATIN/NYSTOP) powder Apply 1 Application topically 3 (three) times daily. NEEDS LARGEST QUANTITY!! 60 g 2   nystatin cream (MYCOSTATIN) APPLY TO AFFECTED AREA TWICE A DAY UNDER BREAST AND ABDOMINAL FOLD 30 g 3   predniSONE (DELTASONE) 10 MG tablet TAKE 1 TABLET BY MOUTH EVERY DAY IN THE MORNING 30 tablet 1   senna (CVS SENNA) 8.6 MG tablet TAKE 2 TABLETS BY MOUTH IN THE AM AND EVENING 56 tablet 2   THICK-IT POWD Take by mouth.     TRIPLE PASTE 12.8 % ointment Apply 1 Application topically as needed for irritation.     HYDROcodone-acetaminophen (NORCO) 5-325 MG tablet Take  1 tablet by mouth every 4 (four) hours. (Patient not taking: Reported on 03/11/2023) 84 tablet 0   OXYCODONE HCL PO Take 5 mg by mouth every 6 (six) hours as needed. (Patient not taking: Reported on 03/11/2023)     No current facility-administered medications on file prior to visit.   Past Medical History:  Diagnosis Date   Atrophy of thyroid    CHF (congestive heart failure) (HCC)    Depression    Hypothyroidism    Migraines    Stroke (HCC) 2004   Vascular dementia [facility])    Past Surgical History:  Procedure Laterality Date   ABDOMINAL HYSTERECTOMY      Family History  Problem Relation Age of Onset   Stroke Other    Hypertension Other    CAD Other    Cancer Other    Social History   Socioeconomic History   Marital status: Widowed    Spouse name: Not on file   Number of children: 3   Years of education: 30   Highest education level: 12th grade  Occupational History   Not on file  Tobacco Use   Smoking status: Never    Passive exposure: Never   Smokeless tobacco: Never  Vaping Use   Vaping status: Never Used  Substance and Sexual Activity   Alcohol use: Never   Drug use: Never   Sexual activity: Not Currently  Other Topics  Concern   Not on file  Social History Narrative   Not on file   Social Determinants of Health   Financial Resource Strain: High Risk (11/16/2021)   Overall Financial Resource Strain (CARDIA)    Difficulty of Paying Living Expenses: Hard  Food Insecurity: No Food Insecurity (05/16/2021)   Hunger Vital Sign    Worried About Running Out of Food in the Last Year: Never true    Ran Out of Food in the Last Year: Never true  Transportation Needs: Unmet Transportation Needs (11/16/2021)   PRAPARE - Transportation    Lack of Transportation (Medical): Yes    Lack of Transportation (Non-Medical): Yes  Physical Activity: Inactive (07/03/2021)   Exercise Vital Sign    Days of Exercise per Week: 0 days    Minutes of Exercise per Session: 0 min  Stress: No Stress Concern Present (05/16/2021)   [employer] of Occupational Health - Occupational Stress Questionnaire    Feeling of Stress : Only a little  Social Connections: Moderately Isolated (05/16/2021)   Social Connection and Isolation Panel [NHANES]    Frequency of Communication with Friends and Family: More than three times a week    Frequency of Social Gatherings with Friends and Family: More than three times a week    Attends Religious Services: 1 to 4 times per year    Active Member of [employer] or Organizations: No    Attends Banker Meetings: Never    Marital Status: Widowed    Objective:  Pulse 82   Temp 99.8 F (37.7 C)   SpO2 95%      04/26/2021   10:39 AM 03/18/2020    4:18 PM 03/18/2020    7:58 AM  BP/Weight  Systolic BP 124 145 121  Diastolic BP 64 55 56    Physical Exam Vitals reviewed.   Zyan is laying in her hospital bed.  She is arousable by her daughter.  She does smile when she hears on the phone or something makes her smile.   Her daughter tried to show me her  toes on the video call.  They were very difficult to view.  They did look like they had some rubor around the nails.  I could not tell  which nails have fallen off.  Diabetic Foot Exam - Simple   No data filed      Lab Results  Component Value Date   WBC 11.8 09/04/2021   HGB 11.4 (A) 09/04/2021   HCT 36 09/04/2021   PLT 257 09/04/2021   GLUCOSE 89 03/18/2020   TRIG 98 03/02/2020   ALT 25 09/04/2021   AST 24 09/04/2021   NA 138 09/04/2021   K 3.8 09/04/2021   CL 102 09/04/2021   CREATININE 1.0 09/04/2021   BUN 35 (H) 03/18/2020   CO2 29 (A) 09/04/2021   TSH 2.01 09/04/2021      Assessment & Plan:    Moderate vascular dementia without behavioral disturbance, psychotic disturbance, mood disturbance, or anxiety (HCC) Assessment & Plan: Progressive.    Acquired hypothyroidism Assessment & Plan: The current medical regimen is effective;  continue present plan and medications. Continue levothyroxine 25 mcg once in am.    Hemiplegia and hemiparesis following cerebral infarction affecting right dominant side (HCC) Assessment & Plan: Stable. Requires full time care.  With Hospice.    Pressure injury of buttock, stage 1, unspecified laterality Assessment & Plan: Continue barrier creams and rotating her.    Broca's aphasia Assessment & Plan: stable   Chronic respiratory failure with hypoxia (HCC) Assessment & Plan: Continue oxygen 2 L continuous.    Chronic congestive heart failure, unspecified heart failure type [facility]) Assessment & Plan: Stable.    Mild recurrent major depression (HCC) Assessment & Plan: Difficult to assess due to aphasia. Continue citalopram 20 mg daily.    Vascular dementia with behavior disturbance (HCC) Assessment & Plan: Progressive.    Other orders -     Furosemide; Take 1 tablet (20 mg total) by mouth 2 (two) times daily.  Dispense: 60 tablet; Refill: 5     Meds ordered this encounter  Medications   furosemide (LASIX) 20 MG tablet    Sig: Take 1 tablet (20 mg total) by mouth 2 (two) times daily.    Dispense:  60 tablet    Refill:  5    No orders  of the defined types were placed in this encounter.   Time:  Today, I have spent 20  minutes with the patient with telehealth technology discussing the above problems.    Follow Up:  Virtual Visit IN 6 MONTHS.  I,Carolyn M Morrison,acting as a Neurosurgeon for Blane Ohara, MD.,have documented all relevant documentation on the behalf of Blane Ohara, MD,as directed by  Blane Ohara, MD while in the presence of Blane Ohara, MD.   An After Visit Summary was printed and given to the patient.  I attest that I have reviewed this visit and agree with the plan scribed by my staff.   Blane Ohara, MD [facility] [phone]

## 2023-03-11 NOTE — Assessment & Plan Note (Signed)
Continue barrier creams and rotating her.

## 2023-03-11 NOTE — Assessment & Plan Note (Signed)
Continue oxygen 2 L continuous.

## 2023-03-11 NOTE — Assessment & Plan Note (Signed)
Stable. Requires full time care.  With Hospice.

## 2023-03-11 NOTE — Assessment & Plan Note (Signed)
The current medical regimen is effective;  continue present plan and medications. Continue levothyroxine 25 mcg once in am.

## 2023-03-11 NOTE — Assessment & Plan Note (Signed)
Difficult to assess due to aphasia. Continue citalopram 20 mg daily.

## 2023-03-11 NOTE — Assessment & Plan Note (Signed)
stable °

## 2023-03-11 NOTE — Assessment & Plan Note (Signed)
Progressive

## 2023-03-21 ENCOUNTER — Other Ambulatory Visit: Payer: Self-pay | Admitting: Family Medicine

## 2023-04-03 ENCOUNTER — Other Ambulatory Visit: Payer: Self-pay | Admitting: Family Medicine

## 2023-04-25 ENCOUNTER — Other Ambulatory Visit: Payer: Self-pay | Admitting: Family Medicine

## 2023-10-10 DEATH — deceased

## 2023-10-16 ENCOUNTER — Telehealth: Payer: Self-pay

## 2023-10-16 NOTE — Telephone Encounter (Signed)
 Contacted Aimee Alf to schedule their annual wellness visit. Call back at later date: Patient did not answer unable to leave message
